# Patient Record
Sex: Female | Born: 1975 | Race: White | Hispanic: No | Marital: Single | Smoking: Never smoker
Health system: Southern US, Community
[De-identification: ages and names within clinical notes are randomized; demographics above are authoritative.]

## PROBLEM LIST (undated history)

## (undated) DIAGNOSIS — F988 Other specified behavioral and emotional disorders with onset usually occurring in childhood and adolescence: Secondary | ICD-10-CM

## (undated) DIAGNOSIS — T7840XA Allergy, unspecified, initial encounter: Secondary | ICD-10-CM

## (undated) DIAGNOSIS — R896 Abnormal cytological findings in specimens from other organs, systems and tissues: Secondary | ICD-10-CM

## (undated) DIAGNOSIS — IMO0001 Reserved for inherently not codable concepts without codable children: Secondary | ICD-10-CM

## (undated) DIAGNOSIS — I10 Essential (primary) hypertension: Secondary | ICD-10-CM

## (undated) DIAGNOSIS — E079 Disorder of thyroid, unspecified: Secondary | ICD-10-CM

## (undated) DIAGNOSIS — F419 Anxiety disorder, unspecified: Secondary | ICD-10-CM

## (undated) DIAGNOSIS — A64 Unspecified sexually transmitted disease: Secondary | ICD-10-CM

## (undated) DIAGNOSIS — R87810 Cervical high risk human papillomavirus (HPV) DNA test positive: Secondary | ICD-10-CM

## (undated) HISTORY — DX: Reserved for inherently not codable concepts without codable children: IMO0001

## (undated) HISTORY — DX: Anxiety disorder, unspecified: F41.9

## (undated) HISTORY — DX: Other specified behavioral and emotional disorders with onset usually occurring in childhood and adolescence: F98.8

## (undated) HISTORY — DX: Disorder of thyroid, unspecified: E07.9

## (undated) HISTORY — PX: DILATION AND CURETTAGE OF UTERUS: SHX78

## (undated) HISTORY — DX: Essential (primary) hypertension: I10

## (undated) HISTORY — DX: Allergy, unspecified, initial encounter: T78.40XA

## (undated) HISTORY — DX: Abnormal cytological findings in specimens from other organs, systems and tissues: R89.6

## (undated) HISTORY — DX: Unspecified sexually transmitted disease: A64

## (undated) HISTORY — PX: COLPOSCOPY: SHX161

## (undated) HISTORY — DX: Cervical high risk human papillomavirus (HPV) DNA test positive: R87.810

---

## 1998-06-30 ENCOUNTER — Inpatient Hospital Stay (HOSPITAL_COMMUNITY): Admission: AD | Admit: 1998-06-30 | Discharge: 1998-07-17 | Payer: Self-pay | Admitting: Obstetrics

## 1998-07-15 ENCOUNTER — Encounter: Payer: Self-pay | Admitting: Obstetrics

## 2010-11-13 ENCOUNTER — Ambulatory Visit (HOSPITAL_COMMUNITY)
Admission: RE | Admit: 2010-11-13 | Discharge: 2010-11-13 | Disposition: A | Discharge status: Home / Self Care | Payer: Self-pay | Source: Ambulatory Visit | Attending: Emergency Medicine | Admitting: Emergency Medicine

## 2010-11-13 DIAGNOSIS — M79609 Pain in unspecified limb: Secondary | ICD-10-CM | POA: Insufficient documentation

## 2010-11-13 DIAGNOSIS — M7989 Other specified soft tissue disorders: Secondary | ICD-10-CM | POA: Insufficient documentation

## 2011-06-01 ENCOUNTER — Other Ambulatory Visit: Payer: Self-pay | Admitting: Internal Medicine

## 2011-06-01 DIAGNOSIS — R1011 Right upper quadrant pain: Secondary | ICD-10-CM

## 2011-06-03 ENCOUNTER — Ambulatory Visit
Admission: RE | Admit: 2011-06-03 | Discharge: 2011-06-03 | Disposition: A | Discharge status: Home / Self Care | Payer: Commercial Managed Care - PPO | Source: Ambulatory Visit | Attending: Internal Medicine | Admitting: Internal Medicine

## 2011-06-03 DIAGNOSIS — R1011 Right upper quadrant pain: Secondary | ICD-10-CM

## 2011-06-05 ENCOUNTER — Other Ambulatory Visit: Payer: Self-pay

## 2011-07-16 ENCOUNTER — Other Ambulatory Visit (HOSPITAL_COMMUNITY)
Admission: RE | Admit: 2011-07-16 | Discharge: 2011-07-16 | Disposition: A | Discharge status: Home / Self Care | Payer: 59 | Source: Ambulatory Visit | Attending: Women's Health | Admitting: Women's Health

## 2011-07-16 ENCOUNTER — Ambulatory Visit (INDEPENDENT_AMBULATORY_CARE_PROVIDER_SITE_OTHER): Payer: 59 | Admitting: Women's Health

## 2011-07-16 ENCOUNTER — Encounter: Payer: Self-pay | Admitting: Women's Health

## 2011-07-16 VITALS — BP 120/80 | Ht 65.0 in | Wt 173.0 lb

## 2011-07-16 DIAGNOSIS — B009 Herpesviral infection, unspecified: Secondary | ICD-10-CM

## 2011-07-16 DIAGNOSIS — E039 Hypothyroidism, unspecified: Secondary | ICD-10-CM

## 2011-07-16 DIAGNOSIS — Z309 Encounter for contraceptive management, unspecified: Secondary | ICD-10-CM

## 2011-07-16 DIAGNOSIS — Z113 Encounter for screening for infections with a predominantly sexual mode of transmission: Secondary | ICD-10-CM

## 2011-07-16 DIAGNOSIS — Z1159 Encounter for screening for other viral diseases: Secondary | ICD-10-CM | POA: Insufficient documentation

## 2011-07-16 DIAGNOSIS — IMO0001 Reserved for inherently not codable concepts without codable children: Secondary | ICD-10-CM

## 2011-07-16 DIAGNOSIS — Z01419 Encounter for gynecological examination (general) (routine) without abnormal findings: Secondary | ICD-10-CM | POA: Insufficient documentation

## 2011-07-16 MED ORDER — VALACYCLOVIR HCL 1 G PO TABS: 1000.0000 mg | ORAL_TABLET | Freq: Two times a day (BID) | ORAL | Status: DC

## 2011-07-16 MED ORDER — DROSPIRENONE-ETHINYL ESTRADIOL 3-0.02 MG PO TABS: 1.0000 | ORAL_TABLET | Freq: Every day | ORAL | Status: DC

## 2011-07-16 NOTE — Progress Notes (Signed)
Alexis French [birth year] [id number]    History:    The patient presents for annual exam.  Monthly 3-4 day cycle on Yaz with several complaints. New patient to practice with questionable history of an abnormal Pap. States has had a right breast lump for greater than one year unchanged. History of HSV   Past medical history, past surgical history, family history and social history were all reviewed and documented in the EPIC chart. Hypertension/hypothyroidism/ADD/anxiety, primary care manages labs and medications. Works at [employer]   ROS:  A  ROS was performed and pertinent positives and negatives are included in the history.  Exam:  Filed Vitals:   07/16/11 0954  BP: 120/80    General appearance:  Normal Head/Neck:  Normal, without cervical or supraclavicular adenopathy. Thyroid:  Symmetrical, normal in size, without palpable masses or nodularity. Respiratory  Effort:  Normal  Auscultation:  Clear without wheezing or rhonchi Cardiovascular  Auscultation:  Regular rate, without rubs, murmurs or gallops  Edema/varicosities:  Not grossly evident Abdominal  Soft,nontender, without masses, guarding or rebound.  Liver/spleen:  No organomegaly noted  Hernia:  None appreciated  Skin  Inspection:  Grossly normal  Palpation:  Grossly normal Neurologic/psychiatric  Orientation:  Normal with appropriate conversation.  Mood/affect:  Normal  Genitourinary    Breasts: Examined lying and sitting.     Right: <.5cm mobile non tender nodule outer aspect, no retractions, discharge or axillary adenopathy.     Left: Without masses, retractions, discharge or axillary adenopathy.   Inguinal/mons:  Normal without inguinal adenopathy  External genitalia:  Normal  BUS/Urethra/Skene's glands:  Normal  Bladder:  Normal  Vagina:  Normal  Cervix:  Normal  Uterus:   normal in size, shape and contour.  Midline and mobile  Adnexa/parametria:     Rt: Without masses or  tenderness.   Lt: Without masses or tenderness.  Anus and perineum: Normal  Digital rectal exam: Normal sphincter tone without palpated masses or tenderness  Assessment/Plan:  36 y.o. S WF G1 P2  for annual exam Contracepting on Yaz with new partner.    HSV Questionable right breast < .5cm lump outer aspect 4 cm from areola at 9 o' clock STD screening Hypertension/hypothyroidism/ADD/anxiety-primary care labs and meds   Plan: Diagnostic mammogram/ultrasound for right breast to be scheduled. Continue SBEs and report changes. Encouraged increased exercise, calcium rich diet, MVI daily. Yaz prescription, proper use, slight risk for blood clots and strokes reviewed. Has done well on and declines a change. Condoms encouraged until permanent partner. Valtrex 1 g by mouth daily did review could use half tablet daily for suppression, states has rare outbreaks. TSH, Pap, GC/Chlamydia, HIV, hep C, RPR, instructed to have old records sent to office for review.    Harrington Challenger WHNP, 5:25 PM 07/16/2011

## 2011-07-17 ENCOUNTER — Telehealth: Payer: Self-pay | Admitting: *Deleted

## 2011-07-17 ENCOUNTER — Other Ambulatory Visit: Payer: Self-pay | Admitting: *Deleted

## 2011-07-17 DIAGNOSIS — N63 Unspecified lump in unspecified breast: Secondary | ICD-10-CM

## 2011-07-17 LAB — RPR

## 2011-07-17 LAB — HIV ANTIBODY (ROUTINE TESTING W REFLEX): HIV: NONREACTIVE

## 2011-07-17 LAB — GC/CHLAMYDIA PROBE AMP, GENITAL: Chlamydia, DNA Probe: NEGATIVE

## 2011-07-17 LAB — HEPATITIS C ANTIBODY: HCV Ab: NEGATIVE

## 2011-07-17 NOTE — Telephone Encounter (Signed)
Message copied by Libby Maw on Fri Jul 17, 2011 10:45 AM ------      Message from: [place], [place] J      Created: Thu Jul 16, 2011 10:37 AM       Needs diagnostic rt br mammogram/us  Questionable rt br nodule at outer aspect left of nipple non tender

## 2011-07-17 NOTE — Telephone Encounter (Signed)
Patient informed appt info.

## 2011-07-17 NOTE — Telephone Encounter (Signed)
Lm for patient to call. Have an appt set at [facility] for 07/24/11 @8 :20am.

## 2011-07-20 ENCOUNTER — Encounter: Payer: Self-pay | Admitting: *Deleted

## 2011-07-20 NOTE — Progress Notes (Signed)
Patient ID: Alexis French, female   DOB: 04/20/1976, 36 y.o.   MRN: [id number] Pt called wanting TSH level results, left results on pt vm as directed.

## 2011-07-24 ENCOUNTER — Other Ambulatory Visit: Payer: 59

## 2011-07-27 ENCOUNTER — Telehealth: Payer: Self-pay | Admitting: *Deleted

## 2011-07-27 NOTE — Telephone Encounter (Signed)
Alexis French please call patient and also inform her records were received/reviewed her Paps were normal with 1 ascus with negative high-risk HPV in past.

## 2011-07-27 NOTE — Telephone Encounter (Signed)
Pt calling wanted most recent pap result from 07/16/11 office visit. Please advise

## 2011-07-27 NOTE — Telephone Encounter (Signed)
Pap was ascus with negative HR HPV. Repeat Pap in 6 months. Please inform patient and put recall in computer

## 2011-07-28 NOTE — Telephone Encounter (Signed)
RECALL LETTER IN COMPUTER FOR 6 MONTH REPEAT PAP.

## 2011-07-28 NOTE — Telephone Encounter (Signed)
Spoke with pt regarding the below note, she is very concerned with why her pap smear is abnormal. Pt wants to speak with you regarding this. Work # [phone] please Francee Piccolo

## 2011-07-28 NOTE — Telephone Encounter (Signed)
Telephone call to patient and reviewed ascus with negative high-risk HPV. Questions answered will return in 6 months for repeat Pap.  Alexis French please put a six-month recall for a Pap repeat in computer- thank you

## 2011-07-28 NOTE — Telephone Encounter (Signed)
Lm for pt to call

## 2011-07-31 ENCOUNTER — Other Ambulatory Visit: Payer: 59

## 2011-08-07 ENCOUNTER — Ambulatory Visit
Admission: RE | Admit: 2011-08-07 | Discharge: 2011-08-07 | Disposition: A | Discharge status: Home / Self Care | Payer: 59 | Source: Ambulatory Visit | Attending: Women's Health | Admitting: Women's Health

## 2011-08-07 ENCOUNTER — Other Ambulatory Visit: Payer: Self-pay | Admitting: Women's Health

## 2011-08-07 DIAGNOSIS — N63 Unspecified lump in unspecified breast: Secondary | ICD-10-CM

## 2011-08-12 ENCOUNTER — Ambulatory Visit
Admission: RE | Admit: 2011-08-12 | Discharge: 2011-08-12 | Disposition: A | Discharge status: Home / Self Care | Payer: 59 | Source: Ambulatory Visit | Attending: Women's Health | Admitting: Women's Health

## 2011-08-12 DIAGNOSIS — N63 Unspecified lump in unspecified breast: Secondary | ICD-10-CM

## 2011-12-08 ENCOUNTER — Ambulatory Visit (INDEPENDENT_AMBULATORY_CARE_PROVIDER_SITE_OTHER): Payer: Commercial Managed Care - PPO | Admitting: Internal Medicine

## 2011-12-08 VITALS — BP 131/81 | HR 123 | Temp 98.3°F | Resp 16 | Ht 64.75 in | Wt 160.2 lb

## 2011-12-08 DIAGNOSIS — I10 Essential (primary) hypertension: Secondary | ICD-10-CM

## 2011-12-08 DIAGNOSIS — G4700 Insomnia, unspecified: Secondary | ICD-10-CM

## 2011-12-08 DIAGNOSIS — L559 Sunburn, unspecified: Secondary | ICD-10-CM

## 2011-12-08 DIAGNOSIS — F419 Anxiety disorder, unspecified: Secondary | ICD-10-CM

## 2011-12-08 DIAGNOSIS — F329 Major depressive disorder, single episode, unspecified: Secondary | ICD-10-CM | POA: Insufficient documentation

## 2011-12-08 DIAGNOSIS — F988 Other specified behavioral and emotional disorders with onset usually occurring in childhood and adolescence: Secondary | ICD-10-CM

## 2011-12-08 DIAGNOSIS — F32A Depression, unspecified: Secondary | ICD-10-CM | POA: Insufficient documentation

## 2011-12-08 DIAGNOSIS — L551 Sunburn of second degree: Secondary | ICD-10-CM

## 2011-12-08 MED ORDER — CLOBETASOL PROPIONATE 0.05 % EX CREA: TOPICAL_CREAM | Freq: Two times a day (BID) | CUTANEOUS | Status: DC

## 2011-12-08 MED ORDER — AMPHETAMINE-DEXTROAMPHET ER 25 MG PO CP24: 25.0000 mg | ORAL_CAPSULE | ORAL | Status: DC

## 2011-12-08 MED ORDER — ZOLPIDEM TARTRATE 10 MG PO TABS: 10.0000 mg | ORAL_TABLET | Freq: Every evening | ORAL | Status: DC | PRN

## 2011-12-08 MED ORDER — IBUPROFEN 600 MG PO TABS: 600.0000 mg | ORAL_TABLET | Freq: Three times a day (TID) | ORAL | Status: AC | PRN

## 2011-12-08 NOTE — Progress Notes (Signed)
  Subjective:    Patient ID: Alexis French, female    DOB: 08-Nov-1975, 36 y.o.   MRN: [id number]  HPI Has severe 1st degree sunburn for 1-2d. Painful, [place], swollen legs and feet 1+ Also needs RF of adderall xr 25mg , and ambien 10mg    Review of Systems     Objective:   Physical Exam Sunburn entire torso Edema lower legs     Assessment & Plan:  Sunburn 1st or mild 2nd Clobetasol cream not face or genitals Motrin 800mg   RF meds

## 2011-12-08 NOTE — Patient Instructions (Signed)
Sunburn  Sunburn is damage to the skin caused by overexposure to ultraviolet (UV) rays. People with light skin or a fair complexion may be more susceptible to sunburn. Repeated sun exposure causes early skin aging such as wrinkles and sun spots. It also increases the risk of skin cancer.  CAUSES  A sunburn is caused by getting too much UV radiation from the sun.  SYMPTOMS   Red or pink skin.   Soreness and swelling.   Pain.   Blisters.   Peeling skin.   Headache, fever, and fatigue if sunburn covers a large area.  TREATMENT   Your caregiver may tell you to take certain medicines to lessen inflammation.   Your caregiver may have you use hydrocortisone cream or spray to help with itching and inflammation.   Your caregiver may prescribe an antibiotic cream to use on blisters.  HOME CARE INSTRUCTIONS    Avoid further exposure to the sun.   Cool baths and cool compresses may be helpful if used several times per day. Do not apply ice, since this may result in more damage to the skin.   Only take over-the-counter or prescription medicines for pain, discomfort, or fever as directed by your caregiver.   Use aloe or other over-the-counter sunburn creams or gels on your skin. Do not apply these creams or gels on blisters.   Drink enough fluids to keep your urine clear or pale yellow.   Do not break blisters. If blisters break, your caregiver may recommend an antibiotic cream to apply to the affected area.  PREVENTION    Try to avoid the sun between 10:00 a.m. and 4:00 p.m. when it is the strongest.   Use a sunscreen or sunblock with SPF 30 or greater.   Apply sunscreen at least 30 minutes before exposure to the sun.   Always wear protective hats, clothing, and sunglasses with UV protection.   Avoid medicines, herbs, and foods that increase your sensitivity to sunlight.   Avoid tanning beds.  SEEK IMMEDIATE MEDICAL CARE IF:    You have a fever.   Your pain is uncontrolled with medicine.   You start to  vomit or have diarrhea.   You feel faint or develop a headache with confusion.   You develop severe blistering.   You have a pus-like (purulent) discharge coming from the blisters.   Your burn becomes more painful and swollen.  MAKE SURE YOU:   Understand these instructions.   Will watch your condition.   Will get help right away if you are not doing well or get worse.  Document Released: 04/01/2005 Document Revised: 06/11/2011 Document Reviewed: 12/14/2010  Exit[employer] Patient Information 2012 [employer].

## 2011-12-09 ENCOUNTER — Telehealth: Payer: Self-pay | Admitting: Family Medicine

## 2011-12-09 ENCOUNTER — Telehealth: Payer: Self-pay | Admitting: *Deleted

## 2011-12-09 MED ORDER — DROSPIRENONE-ETHINYL ESTRADIOL 3-0.02 MG PO TABS: 1.0000 | ORAL_TABLET | Freq: Every day | ORAL | Status: DC

## 2011-12-09 MED ORDER — SILVER SULFADIAZINE 1 % EX CREA: TOPICAL_CREAM | Freq: Every day | CUTANEOUS | Status: DC

## 2011-12-09 NOTE — Telephone Encounter (Signed)
Pt.notified

## 2011-12-09 NOTE — Telephone Encounter (Signed)
Patient would like a prescription for silvadene for severe sunburn

## 2011-12-09 NOTE — Telephone Encounter (Signed)
Sent to pharmacy 

## 2011-12-09 NOTE — Telephone Encounter (Signed)
rx sent to pharmacy, pt informed. 

## 2011-12-09 NOTE — Telephone Encounter (Signed)
Please call in brand name only Yaz one by mouth daily with refills through her next annual exam. Have her call if problems continue.

## 2011-12-09 NOTE — Telephone Encounter (Signed)
Pt would like to switch birth control to brand only yaz, she is currently taking the generic brand, she c/o more mood swings that she didn't have with taking brand. Okay to switch?

## 2012-01-24 ENCOUNTER — Ambulatory Visit (INDEPENDENT_AMBULATORY_CARE_PROVIDER_SITE_OTHER): Payer: Commercial Managed Care - PPO | Admitting: Physician Assistant

## 2012-01-24 VITALS — BP 124/82 | HR 70 | Temp 98.5°F | Resp 16 | Ht 64.75 in | Wt 160.0 lb

## 2012-01-24 DIAGNOSIS — K1370 Unspecified lesions of oral mucosa: Secondary | ICD-10-CM

## 2012-01-24 DIAGNOSIS — K0889 Other specified disorders of teeth and supporting structures: Secondary | ICD-10-CM

## 2012-01-24 MED ORDER — CLINDAMYCIN HCL 300 MG PO CAPS: 300.0000 mg | ORAL_CAPSULE | Freq: Three times a day (TID) | ORAL | Status: DC

## 2012-01-25 ENCOUNTER — Encounter: Payer: Self-pay | Admitting: Physician Assistant

## 2012-01-25 NOTE — Progress Notes (Signed)
  Subjective:    Patient ID: Alexis French, female    DOB: 31-Oct-1975, 36 y.o.   MRN: [id number]  HPI Alexis French comes in today c/o upper left molar pain s/p extraction earlier in week.  She has noticed a bad taste in her mouth and feels that her breath smells.  She has pain at the extraction site and feels that a remnant of the tooth remains because she feels something sharp.  She saw the surgeon 2 days ago and was told she had a dry socket and was put on prednisone.  She feels it is getting worse. No fever or chills.   Review of Systems As noted in HPI, otherwise negative     Objective:   Physical Exam  HENT:  Mouth/Throat: No oral lesions. No dental abscesses or lacerations.         Right 4th left molar site with normal gingiva.  Packing noted in socket.  No active drainage noted.  No gingival swelling noted.  Lymphadenopathy:       Head (left side): No submental, no submandibular and no tonsillar adenopathy present.    She has no cervical adenopathy.  Skin: Skin is warm.          Assessment & Plan:  Tooth pain, s/p extraction ?early abscess  Start Clindamycin 300 mg tid Gargles Follow up in the morning with dentist/surgeon

## 2012-01-27 ENCOUNTER — Encounter: Payer: Self-pay | Admitting: Physician Assistant

## 2012-01-27 ENCOUNTER — Ambulatory Visit: Payer: Commercial Managed Care - PPO | Admitting: Physician Assistant

## 2012-01-27 VITALS — BP 120/82 | HR 72 | Temp 98.7°F | Resp 16

## 2012-01-27 DIAGNOSIS — F411 Generalized anxiety disorder: Secondary | ICD-10-CM

## 2012-01-27 DIAGNOSIS — F988 Other specified behavioral and emotional disorders with onset usually occurring in childhood and adolescence: Secondary | ICD-10-CM

## 2012-01-27 DIAGNOSIS — R404 Transient alteration of awareness: Secondary | ICD-10-CM

## 2012-01-27 DIAGNOSIS — B009 Herpesviral infection, unspecified: Secondary | ICD-10-CM

## 2012-01-27 DIAGNOSIS — E039 Hypothyroidism, unspecified: Secondary | ICD-10-CM

## 2012-01-27 DIAGNOSIS — I10 Essential (primary) hypertension: Secondary | ICD-10-CM

## 2012-01-27 DIAGNOSIS — IMO0001 Reserved for inherently not codable concepts without codable children: Secondary | ICD-10-CM

## 2012-01-27 DIAGNOSIS — G4700 Insomnia, unspecified: Secondary | ICD-10-CM

## 2012-01-27 DIAGNOSIS — F419 Anxiety disorder, unspecified: Secondary | ICD-10-CM

## 2012-01-27 LAB — COMPREHENSIVE METABOLIC PANEL
Albumin: 4.3 g/dL (ref 3.5–5.2)
Alkaline Phosphatase: 60 U/L (ref 39–117)
BUN: 13 mg/dL (ref 6–23)
CO2: 30 mEq/L (ref 19–32)
Calcium: 9.8 mg/dL (ref 8.4–10.5)
Chloride: 101 mEq/L (ref 96–112)
Glucose, Bld: 88 mg/dL (ref 70–99)
Potassium: 4.6 mEq/L (ref 3.5–5.3)
Sodium: 137 mEq/L (ref 135–145)
Total Protein: 7.4 g/dL (ref 6.0–8.3)

## 2012-01-27 MED ORDER — NORETHIN ACE-ETH ESTRAD-FE 1-20 MG-MCG(24) PO TABS: 1.0000 | ORAL_TABLET | Freq: Every day | ORAL | Status: DC

## 2012-01-27 MED ORDER — METHOCARBAMOL 500 MG PO TABS: 500.0000 mg | ORAL_TABLET | Freq: Three times a day (TID) | ORAL | Status: DC

## 2012-01-27 MED ORDER — ZOLPIDEM TARTRATE 10 MG PO TABS: 10.0000 mg | ORAL_TABLET | Freq: Every evening | ORAL | Status: DC | PRN

## 2012-01-27 MED ORDER — HYDROCHLOROTHIAZIDE 25 MG PO TABS: 25.0000 mg | ORAL_TABLET | Freq: Every day | ORAL | Status: DC

## 2012-01-27 MED ORDER — VALACYCLOVIR HCL 1 G PO TABS: 1000.0000 mg | ORAL_TABLET | Freq: Two times a day (BID) | ORAL | Status: DC

## 2012-01-27 MED ORDER — AMPHETAMINE-DEXTROAMPHET ER 25 MG PO CP24: 25.0000 mg | ORAL_CAPSULE | ORAL | Status: DC

## 2012-01-27 NOTE — Progress Notes (Signed)
Subjective:    Patient ID: Alexis French, female    DOB: 02-20-1976, 36 y.o.   MRN: [id number]  HPI Pt here to establish care and medication refills. 1- HTN - well controlled on HCTZ which is mainly for LE edema.  She has not used her Micardis in about 1.5 wks. 2- ADD - Adderall XR25mg  qam works well for her. 3- insomni a- well controlled with nightly Ambien 10mg . 4- spinal stenosis - uses Robaxin prn for muscle spasms 4- HSV genital - well controlled on daily valtrex, only has outbreaks when she is really stressed for a long period of time 5- birth control - currently on Yaz but has noticed that her menses are irregular and typically start sometimes in the 1st week of her pack - she would like to have more control over her menses - she had an ASCUS with neg HPV pap 1/13 she is thinking of having her next one here. 6- anxiety -significant improvement from the past - new job has helped - rarely takes her Xanax and has stopped her Buspar. 7- hypothyroidism - feels tired all the time and cannot seem to lose weight- hungry all the time - wonders if her control is off - she uses generic synthroid  Today feels well.   Review of Systems  Constitutional: Positive for fatigue.  Psychiatric/Behavioral: Positive for disturbed wake/sleep cycle.       Objective:   Physical Exam  Vitals reviewed. Constitutional: She is oriented to person, place, and time. She appears well-developed and well-nourished.  HENT:  Head: Normocephalic and atraumatic.  Right Ear: External ear normal.  Left Ear: External ear normal.  Neck: Neck supple. No thyromegaly present.  Cardiovascular: Normal rate, regular rhythm and normal heart sounds.  Exam reveals no gallop.   No murmur heard. Pulmonary/Chest: Effort normal and breath sounds normal.  Neurological: She is alert and oriented to person, place, and time.  Skin: Skin is warm and dry.  Psychiatric: She has a normal mood and affect. Her behavior is normal.  Judgment and thought content normal.       Assessment & Plan:   1. Hypothyroidism  TSH  2. HSV infection  valACYclovir (VALTREX) 1000 MG tablet, DISCONTINUED: valACYclovir (VALTREX) 1000 MG tablet  3. Anxiety  zolpidem (AMBIEN) 10 MG tablet  4. Insomnia  zolpidem (AMBIEN) 10 MG tablet, methocarbamol (ROBAXIN) 500 MG tablet, DISCONTINUED: methocarbamol (ROBAXIN) 500 MG tablet  5. ADD (attention deficit disorder)  amphetamine-dextroamphetamine (ADDERALL XR) 25 MG 24 hr capsule, hydrochlorothiazide (HYDRODIURIL) 25 MG tablet, DISCONTINUED: hydrochlorothiazide (HYDRODIURIL) 25 MG tablet  6. HTN (hypertension)  Comprehensive metabolic panel, hydrochlorothiazide (HYDRODIURIL) 25 MG tablet, DISCONTINUED: hydrochlorothiazide (HYDRODIURIL) 25 MG tablet  7. Birth control  Norethindrone Acetate-Ethinyl Estrad-FE (LOESTRIN 24 FE) 1-20 MG-MCG(24) tablet, DISCONTINUED: Norethindrone Acetate-Ethinyl Estrad-FE (LOESTRIN 24 FE) 1-20 MG-MCG(24) tablet   1- will check TSH and adjust medication as necessary. D/w pt will try brand to see if better control. 2- continue daily suppressive therapy 3- continue prn medication - if usage increases in the future will start daily medication 4- continue daily medication - can refill for 6 months 5- continue daily medication  - can refill for 6 months 6- will stop micardis for now due to good BP control - pt to monitor over the next several weeks and if BP starts to increase will restart medication - will continue on HCTZ for now 7- pt desires to continue on OCP.  Will stop YAz and start loestrin 24 to keep estrogen  dose low - if over the next 3 months pt continues to have irregular menses start will increase estrogen  Pt agrees with and understands the above plan.  Will plan recheck in 2 months.

## 2012-01-28 ENCOUNTER — Other Ambulatory Visit: Payer: Self-pay | Admitting: Physician Assistant

## 2012-01-28 MED ORDER — LEVOTHYROXINE SODIUM 150 MCG PO TABS: 150.0000 ug | ORAL_TABLET | Freq: Every day | ORAL | Status: DC

## 2012-01-28 NOTE — Telephone Encounter (Signed)
Spoke with pt regarding  Her thyroid results and medication dose.  Meds sent to pharmacy.

## 2012-02-24 ENCOUNTER — Ambulatory Visit (INDEPENDENT_AMBULATORY_CARE_PROVIDER_SITE_OTHER): Payer: Commercial Managed Care - PPO | Admitting: Family Medicine

## 2012-02-24 DIAGNOSIS — M25569 Pain in unspecified knee: Secondary | ICD-10-CM

## 2012-02-24 DIAGNOSIS — M25562 Pain in left knee: Secondary | ICD-10-CM

## 2012-02-24 NOTE — Assessment & Plan Note (Signed)
Patient has left knee pain. Patient does not have any meniscal signs or any ligamentous type injury. Patient has no signs of swelling at this time either. Patient does have some mild laxity over all of all her ligaments bilaterally but I do feel that this is a normal variant. Gave patient different exercises to do at home strengthening mostly the hamstrings as well as the quadriceps. In addition this patient told to get a knee sleeve that she can get over-the-counter. Told her to that potentially been on her feet all day might be contributing to this and she needs better insoles to relieve some of the stress. Patient will try these things and follow up in 4 weeks.

## 2012-02-24 NOTE — Progress Notes (Signed)
  Subjective:    Patient ID: Alexis French, female    DOB: 02/04/1976, 36 y.o.   MRN: [id number]  HPI 36 year old female coming in for left knee pain. Patient has had this pain for which he says a year and half. Patient states that the initial injury occurred when her dog ran into her causing her to rotate your knee and felt a pop and had significant amount of swelling. Since that time the knee has felt somewhat unstable with certain movements. Recently she states that she's had some more swelling with the knee. Patient denies any redness any fevers chills or shortness of breath. Patient denies any locking of the knee any cracking popping or giving out on her. Patient denies any numbness of the extremity distally.  Patient states for the pain she is taking Robaxin with minimal improvement. Review of Systems As stated above in history of present illness    Objective:   Physical Exam General: No apparent distress alert and oriented x3 Knee:left Normal to inspection with no erythema or effusion or obvious bony abnormalities. Palpation normal with no warmth or joint line tenderness or patellar tenderness or condyle tenderness. ROM normal in flexion and extension and lower leg rotation. Ligaments with solid consistent endpoints including ACL, PCL, LCL, MCL compared to contralateral knee. Negative Mcmurray's and provocative meniscal tests. Non painful patellar compression. Patellar and quadriceps tendons unremarkable. Hamstring and quadriceps strength is normal.      Assessment & Plan:

## 2012-02-28 ENCOUNTER — Telehealth: Payer: Self-pay

## 2012-02-28 DIAGNOSIS — F988 Other specified behavioral and emotional disorders with onset usually occurring in childhood and adolescence: Secondary | ICD-10-CM

## 2012-02-28 MED ORDER — AMPHETAMINE-DEXTROAMPHET ER 25 MG PO CP24: 25.0000 mg | ORAL_CAPSULE | ORAL | Status: DC

## 2012-02-28 NOTE — Telephone Encounter (Signed)
rx printed

## 2012-02-28 NOTE — Telephone Encounter (Signed)
Pt needs a RF of her Adderall. Thanks

## 2012-03-02 NOTE — Progress Notes (Signed)
Xray read and patient discussed with Dr. Smith. Agree with assessment and plan of care per his note.   

## 2012-03-30 ENCOUNTER — Ambulatory Visit (INDEPENDENT_AMBULATORY_CARE_PROVIDER_SITE_OTHER): Payer: 59 | Admitting: Family Medicine

## 2012-03-30 VITALS — BP 144/94 | HR 93 | Temp 98.7°F | Resp 16

## 2012-03-30 DIAGNOSIS — Z207 Contact with and (suspected) exposure to pediculosis, acariasis and other infestations: Secondary | ICD-10-CM

## 2012-03-30 DIAGNOSIS — R233 Spontaneous ecchymoses: Secondary | ICD-10-CM

## 2012-03-30 DIAGNOSIS — Z2089 Contact with and (suspected) exposure to other communicable diseases: Secondary | ICD-10-CM

## 2012-03-30 LAB — TSH: TSH: 0.642 u[IU]/mL (ref 0.350–4.500)

## 2012-03-30 LAB — POCT CBC
HCT, POC: 46.5 % (ref 37.7–47.9)
Hemoglobin: 15 g/dL (ref 12.2–16.2)
Lymph, poc: 2.1 (ref 0.6–3.4)
MCH, POC: 33 pg — AB (ref 27–31.2)
MCHC: 32.3 g/dL (ref 31.8–35.4)
MCV: 102.4 fL — AB (ref 80–97)
POC Granulocyte: 4.4 (ref 2–6.9)
POC LYMPH PERCENT: 30.1 %L (ref 10–50)
RDW, POC: 12.6 %
WBC: 6.9 10*3/uL (ref 4.6–10.2)

## 2012-03-30 LAB — COMPREHENSIVE METABOLIC PANEL
Alkaline Phosphatase: 68 U/L (ref 39–117)
BUN: 10 mg/dL (ref 6–23)
CO2: 29 mEq/L (ref 19–32)
Glucose, Bld: 102 mg/dL — ABNORMAL HIGH (ref 70–99)
Sodium: 138 mEq/L (ref 135–145)
Total Bilirubin: 0.5 mg/dL (ref 0.3–1.2)
Total Protein: 7.3 g/dL (ref 6.0–8.3)

## 2012-03-30 LAB — POCT SEDIMENTATION RATE: POCT SED RATE: 15 mm/hr (ref 0–22)

## 2012-03-30 MED ORDER — NYSTATIN-TRIAMCINOLONE 100000-0.1 UNIT/GM-% EX OINT: TOPICAL_OINTMENT | Freq: Two times a day (BID) | CUTANEOUS | Status: DC

## 2012-03-30 MED ORDER — PERMETHRIN 5 % EX CREA: TOPICAL_CREAM | CUTANEOUS | Status: DC

## 2012-03-30 NOTE — Progress Notes (Signed)
[address]   [place], [place]  [address]   [phone]  Subjective:    Patient ID: Alexis French, female    DOB: [birth year], 36 y.o.   MRN: [id number]  HPIThis 36 y.o. female presents for evaluation of scabies exposure.  Took sons to doctor due to blisters on toes.  Scabs on both legs from itching.  Doctor did not give diagnosis; sent in rx to pharmacy; picked up rx for Prednisone, Premethrin cream.  No rash on patient currently but very worried that may spread scabies to patients and coworkers; desires to go home/leave work and undergo treatment for scabies.  Has some itching. Has bite L posterior axilla.  Would like to go home.   Very worried that may have scabies.  Not pleased at all with children's physician.  Little neighbor kid now has rash.  No new pets.  Not sure if neighbors have new pets.  Very paranoid.  Did not call pediatrician regarding medication until today; nurse did state that diagnosis was scabies.  2.  Leg rash: onset one month ago.  No associated itching, burning, pain.  No pustules, vesicles, scaling.  Rash is spreading.  Started as one single lesion on RLE; now has single lesion on LLE and multiple areas on RLE.  No new pets.  No nodules under skin.  Did switch OCP recently;also switched from levothyroxine to Synthroid recently.  Last TSH normal in 01/2012 but has had labile levels; thus, the switch to name brand Synthroid.   Review of Systems  Constitutional: Negative for fever, chills, activity change, appetite change, fatigue and unexpected weight change.  Skin: Positive for color change and rash. Negative for pallor and wound.  Hematological: Negative for adenopathy. Does not bruise/bleed easily.  Psychiatric/Behavioral: The patient is nervous/anxious.         Past Medical History  Diagnosis Date  . Thyroid disease     hyperthyroidism  . Hypertension   . STD (sexually transmitted disease)     HSV2  . Attention deficit disorder (ADD)   . Anxiety     Past  Surgical History  Procedure Date  . Dilation and curettage of uterus APPROX. 2006    Prior to Admission medications   Medication Sig Start Date End Date Taking? Authorizing Provider  ALPRAZolam Prudy Feeler) 1 MG tablet Take 1 mg by mouth at bedtime as needed.   Yes Historical Provider, MD  amphetamine-dextroamphetamine (ADDERALL XR) 25 MG 24 hr capsule Take 1 capsule (25 mg total) by mouth every morning. 02/28/12  Yes Chelle S Jeffery, PA-C  b complex vitamins tablet Take 1 tablet by mouth daily.   Yes Historical Provider, MD  hydrochlorothiazide (HYDRODIURIL) 25 MG tablet Take 1 tablet (25 mg total) by mouth daily. 01/27/12  Yes Morrell Riddle, PA-C  levothyroxine (SYNTHROID, LEVOTHROID) 150 MCG tablet Take 1 tablet (150 mcg total) by mouth daily. 01/28/12  Yes Morrell Riddle, PA-C  methocarbamol (ROBAXIN) 500 MG tablet Take 1 tablet (500 mg total) by mouth 3 (three) times daily. 01/27/12  Yes Morrell Riddle, PA-C  Norethindrone Acetate-Ethinyl Estrad-FE (LOESTRIN 24 FE) 1-20 MG-MCG(24) tablet Take 1 tablet by mouth daily. 01/27/12 01/26/13 Yes Sarah Harvie Bridge, PA-C  valACYclovir (VALTREX) 1000 MG tablet Take 1 tablet (1,000 mg total) by mouth 2 (two) times daily. 01/27/12  Yes Morrell Riddle, PA-C  zolpidem (AMBIEN) 10 MG tablet Take 1 tablet (10 mg total) by mouth at bedtime as needed. 01/27/12  Yes Morrell Riddle, PA-C  Allergies  Allergen Reactions  . Penicillins     History   Social History  . Marital Status: Single    Spouse Name: N/A    Number of Children: N/A  . Years of Education: N/A   Occupational History  . Not on file.   Social History Main Topics  . Smoking status: Never Smoker   . Smokeless tobacco: Never Used  . Alcohol Use: Yes     SOCIALLY ONLY  . Drug Use: No  . Sexually Active: Yes    Birth Control/ Protection: Pill   Other Topics Concern  . Not on file   Social History Narrative  . No narrative on file    Family History  Problem Relation Age of Onset  .  Diabetes Mother   . Hypertension Mother   . Diabetes Sister   . Diabetes Maternal Grandmother   . Hypertension Maternal Grandfather   . Diabetes Paternal Grandmother     Objective:   Physical Exam  Nursing note and vitals reviewed. Constitutional: She appears well-developed and well-nourished.  Cardiovascular: Intact distal pulses.   Musculoskeletal: She exhibits no edema.  Skin: Rash noted. No erythema. No pallor.       L POSTERIOR SHOULDER: SINGLE MACULOPAPULAR LESION WITHOUT PUSTULE, VESICLE; VERY MINIMAL INDURATION; NO ERYTHEMA.  NO RASH OF INTERDIGIT SPACES, AXILLARY REGIONS, LOWER EXTREMITIES AT ANKLES, UPPER EXTREMITY AT WRISTS.  LOWER EXTREMITIES:  R>L; ANNULAR RASH NON-BLANCHING, PETECHIAL IN NATURE LOWER LEG; NO ASSOCIATED SCALING; NOT RAISED ABOVE SKIN. MULTIPLE ANNULAR LESIONS R LOWER LEG; 1-2 LESIONS L LOWER LEG. NO NODULARITY TO LESIONS; NON-TENDER.       Results for orders placed in visit on 03/30/12  POCT CBC      Component Value Range   WBC 6.9  4.6 - 10.2 K/uL   Lymph, poc 2.1  0.6 - 3.4   POC LYMPH PERCENT 30.1  10 - 50 %L   MID (cbc) 0.4  0 - 0.9   POC MID % 5.8  0 - 12 %M   POC Granulocyte 4.4  2 - 6.9   Granulocyte percent 64.1  37 - 80 %G   RBC 4.54  4.04 - 5.48 M/uL   Hemoglobin 15.0  12.2 - 16.2 g/dL   HCT, POC 16.1  09.6 - 47.9 %   MCV 102.4 (*) 80 - 97 fL   MCH, POC 33.0 (*) 27 - 31.2 pg   MCHC 32.3  31.8 - 35.4 g/dL   RDW, POC 04.5     Platelet Count, POC 307  142 - 424 K/uL   MPV 8.6  0 - 99.8 fL    Assessment & Plan:   1. Petechial rash  POCT CBC, Comprehensive metabolic panel, TSH, POCT SEDIMENTATION RATE, nystatin-triamcinolone ointment (MYCOLOG)  2. Scabies exposure  permethrin (ACTICIN) 5 % cream     1.  Petechial annular rash BLE:  New.  Etiology unclear; non-blanching to suggest vascular etiology.  Obtain labs including TSH, CBC, CMET, ESR.  Treat empirically with Mycolog cream.  If no improvement in two weeks, recommend  dermatology consultation. 2.  Scabies exposure: New.  Two children undergoing treatment for scabies; patient without active lesions yet concern for spreading scabies to immunosuppressed patients; rx provided to use tonight; note for work provided for today.

## 2012-03-30 NOTE — Patient Instructions (Addendum)
1. Petechial rash  POCT CBC, Comprehensive metabolic panel, TSH, POCT SEDIMENTATION RATE, nystatin-triamcinolone ointment (MYCOLOG)  2. Scabies exposure  permethrin (ACTICIN) 5 % cream   Scabies Scabies are small bugs (mites) that burrow under the skin and cause red bumps and severe itching. These bugs can only be seen with a microscope. Scabies are highly contagious. They can spread easily from person to person by direct contact. They are also spread through sharing clothing or linens that have the scabies mites living in them. It is not unusual for an entire family to become infected through shared towels, clothing, or bedding.  HOME CARE INSTRUCTIONS   Your caregiver may prescribe a cream or lotion to kill the mites. If this cream is prescribed; massage the cream into the entire area of the body from the neck to the bottom of both feet. Also massage the cream into the scalp and face if your child is less than 44 year old. Avoid the eyes and mouth.   Leave the cream on for 8 to12 hours. Do not wash your hands after application. Your child should bathe or shower after the 8 to 12 hour application period. Sometimes it is helpful to apply the cream to your child at right before bedtime.   One treatment is usually effective and will eliminate approximately 95% of infestations. For severe cases, your caregiver may decide to repeat the treatment in 1 week. Everyone in your household should be treated with one application of the cream.   New rashes or burrows should not appear after successful treatment within 24 to 48 hours; however the itching and rash may last for 2 to 4 weeks after successful treatment. If your symptoms persist longer than this, see your caregiver.   Your caregiver also may prescribe a medication to help with the itching or to help the rash go away more quickly.   Scabies can live on clothing or linens for up to 3 days. Your entire child's recently used clothing, towels, stuffed toys,  and bed linens should be washed in hot water and then dried in a dryer for at least 20 minutes on high heat. Items that cannot be washed should be enclosed in a plastic bag for at least 3 days.   To help relieve itching, bathe your child in a cool bath or apply cool washcloths to the affected areas.   Your child may return to school after treatment with the prescribed cream.  SEEK MEDICAL CARE IF:   The itching persists longer than 4 weeks after treatment.   The rash spreads or becomes infected (the area has red blisters or yellow-tan crust).  Document Released: 06/22/2005 Document Revised: 06/11/2011 Document Reviewed: 10/31/2008 [facility] Patient Information 2012 [place], [place].

## 2012-04-03 ENCOUNTER — Telehealth: Payer: Self-pay

## 2012-04-03 DIAGNOSIS — F988 Other specified behavioral and emotional disorders with onset usually occurring in childhood and adolescence: Secondary | ICD-10-CM

## 2012-04-03 MED ORDER — AMPHETAMINE-DEXTROAMPHET ER 25 MG PO CP24: 25.0000 mg | ORAL_CAPSULE | ORAL | Status: DC

## 2012-04-03 NOTE — Progress Notes (Signed)
Reviewed and agree.

## 2012-04-03 NOTE — Telephone Encounter (Signed)
Refill done and printed

## 2012-04-03 NOTE — Telephone Encounter (Signed)
Pt notified that rx is ready for pick up

## 2012-04-03 NOTE — Telephone Encounter (Signed)
Pt needs a RF of Adderall

## 2012-04-26 ENCOUNTER — Telehealth: Payer: Self-pay

## 2012-04-26 DIAGNOSIS — F988 Other specified behavioral and emotional disorders with onset usually occurring in childhood and adolescence: Secondary | ICD-10-CM

## 2012-04-26 DIAGNOSIS — IMO0001 Reserved for inherently not codable concepts without codable children: Secondary | ICD-10-CM

## 2012-04-26 DIAGNOSIS — I10 Essential (primary) hypertension: Secondary | ICD-10-CM

## 2012-04-26 NOTE — Telephone Encounter (Signed)
Alexis French, Pt needs a RF of HCTZ, Loestrin, and Synthroid. Thanks

## 2012-04-27 MED ORDER — LEVOTHYROXINE SODIUM 150 MCG PO TABS: 150.0000 ug | ORAL_TABLET | Freq: Every day | ORAL | Status: DC

## 2012-04-27 NOTE — Telephone Encounter (Signed)
Spoke to patient

## 2012-04-27 NOTE — Telephone Encounter (Signed)
I have sent a RF of synthroid - the other 2 should have refills - we do need to do her labs.

## 2012-05-05 ENCOUNTER — Ambulatory Visit (INDEPENDENT_AMBULATORY_CARE_PROVIDER_SITE_OTHER): Payer: 59 | Admitting: Physician Assistant

## 2012-05-05 VITALS — BP 130/92 | HR 90 | Temp 98.1°F | Resp 16

## 2012-05-05 DIAGNOSIS — E039 Hypothyroidism, unspecified: Secondary | ICD-10-CM

## 2012-05-05 DIAGNOSIS — F988 Other specified behavioral and emotional disorders with onset usually occurring in childhood and adolescence: Secondary | ICD-10-CM

## 2012-05-05 MED ORDER — AMPHETAMINE-DEXTROAMPHET ER 25 MG PO CP24: 25.0000 mg | ORAL_CAPSULE | ORAL | Status: DC

## 2012-05-05 NOTE — Progress Notes (Signed)
   [address] [place] [address]   Phone [phone]  Subjective:    Patient ID: Alexis French, female    DOB: [birth year], 36 y.o.   MRN: [id number]  HPI Pt here for ADD med refill.  She is doing well on her current dose.  She has been on Adderall XR 30 and did not like it. She is otherwise doing good.  Reviewed her labs - she is on upper limit of normal for her thyroid.  We will monitor in 6 months.  She is currently taking brand name.  Happy with her OCP change - she is not having breakthrough bleeding anymore.  Review of Systems  Psychiatric/Behavioral: Positive for decreased concentration (only when she does not take her medications).       Objective:   Physical Exam  Vitals reviewed. Constitutional: She is oriented to person, place, and time. She appears well-developed and well-nourished.  HENT:  Head: Normocephalic and atraumatic.  Right Ear: External ear normal.  Left Ear: External ear normal.  Pulmonary/Chest: Effort normal.  Neurological: She is alert and oriented to person, place, and time.  Skin: Skin is warm and dry.  Psychiatric: She has a normal mood and affect. Her behavior is normal. Judgment and thought content normal.          Assessment & Plan:   1. ADD (attention deficit disorder)  amphetamine-dextroamphetamine (ADDERALL XR) 25 MG 24 hr capsule, amphetamine-dextroamphetamine (ADDERALL XR) 25 MG 24 hr capsule, amphetamine-dextroamphetamine (ADDERALL XR) 25 MG 24 hr capsule   Recheck in 3 months for TSH and 6 months for ADD.

## 2012-05-24 ENCOUNTER — Telehealth: Payer: Self-pay | Admitting: Family Medicine

## 2012-05-24 MED ORDER — DESOGESTREL-ETHINYL ESTRADIOL 0.15-30 MG-MCG PO TABS: 1.0000 | ORAL_TABLET | Freq: Every day | ORAL | Status: DC

## 2012-05-24 NOTE — Telephone Encounter (Signed)
Pt called- states that BCP are not regulating her cycle- she is having break-through bleeding and ACNE!!!! Would like something else called in please. Pharmacy- [facility]  Thanks! Eileen Stanford

## 2012-07-14 ENCOUNTER — Other Ambulatory Visit: Payer: Self-pay | Admitting: Physician Assistant

## 2012-07-14 NOTE — Telephone Encounter (Signed)
Patient advised her meds were sent in for her.

## 2012-07-21 ENCOUNTER — Telehealth: Payer: Self-pay

## 2012-07-21 NOTE — Telephone Encounter (Signed)
Pt wants to know if along with the Adderall XR if she could also get a short acting Adderall (possibly 20mg ) to take when she has to work nights and weekends. If she tries to take a second Adderall XR it keeps her up all night. Please send back to me with reply/advise. Thanks [employer]

## 2012-07-21 NOTE — Telephone Encounter (Signed)
Pt and I discussed.  She will try to not take Adderall on days she does not work.  We can try a short acting but am afraid that she will gets ups and downs from it like she did when she started the medication.  Pt is going to schedule an appt an we are going to try to decrease her dose but for now she will not take a double dose and she will not take it on days she does not have to be at work.

## 2012-08-20 ENCOUNTER — Other Ambulatory Visit: Payer: Self-pay

## 2012-09-02 ENCOUNTER — Telehealth: Payer: Self-pay | Admitting: *Deleted

## 2012-09-02 DIAGNOSIS — F988 Other specified behavioral and emotional disorders with onset usually occurring in childhood and adolescence: Secondary | ICD-10-CM

## 2012-09-02 MED ORDER — AMPHETAMINE-DEXTROAMPHET ER 25 MG PO CP24: 25.0000 mg | ORAL_CAPSULE | ORAL | Status: DC

## 2012-09-02 NOTE — Telephone Encounter (Signed)
In order to change Adderall dosage needs an OV.

## 2012-09-02 NOTE — Telephone Encounter (Signed)
Patient requests refill on Adderall and would like prescription for regular Adderall for weekends and short days of work.

## 2012-09-03 DIAGNOSIS — R87810 Cervical high risk human papillomavirus (HPV) DNA test positive: Secondary | ICD-10-CM

## 2012-09-03 HISTORY — DX: Cervical high risk human papillomavirus (HPV) DNA test positive: R87.810

## 2012-09-07 ENCOUNTER — Ambulatory Visit: Payer: 59 | Admitting: Physician Assistant

## 2012-09-13 ENCOUNTER — Other Ambulatory Visit (HOSPITAL_COMMUNITY)
Admission: RE | Admit: 2012-09-13 | Discharge: 2012-09-13 | Disposition: A | Discharge status: Home / Self Care | Payer: 59 | Source: Ambulatory Visit | Attending: Gynecology | Admitting: Gynecology

## 2012-09-13 ENCOUNTER — Encounter: Payer: Self-pay | Admitting: Gynecology

## 2012-09-13 ENCOUNTER — Ambulatory Visit (INDEPENDENT_AMBULATORY_CARE_PROVIDER_SITE_OTHER): Payer: 59 | Admitting: Gynecology

## 2012-09-13 VITALS — BP 124/78 | Ht 64.5 in | Wt 167.0 lb

## 2012-09-13 DIAGNOSIS — R87810 Cervical high risk human papillomavirus (HPV) DNA test positive: Secondary | ICD-10-CM | POA: Insufficient documentation

## 2012-09-13 DIAGNOSIS — N6089 Other benign mammary dysplasias of unspecified breast: Secondary | ICD-10-CM

## 2012-09-13 DIAGNOSIS — Z01419 Encounter for gynecological examination (general) (routine) without abnormal findings: Secondary | ICD-10-CM | POA: Insufficient documentation

## 2012-09-13 DIAGNOSIS — N6081 Other benign mammary dysplasias of right breast: Secondary | ICD-10-CM

## 2012-09-13 DIAGNOSIS — Z1151 Encounter for screening for human papillomavirus (HPV): Secondary | ICD-10-CM | POA: Insufficient documentation

## 2012-09-13 DIAGNOSIS — B009 Herpesviral infection, unspecified: Secondary | ICD-10-CM

## 2012-09-13 MED ORDER — DESOGESTREL-ETHINYL ESTRADIOL 0.15-30 MG-MCG PO TABS: 1.0000 | ORAL_TABLET | Freq: Every day | ORAL | Status: DC

## 2012-09-13 MED ORDER — VALACYCLOVIR HCL 1 G PO TABS: 1000.0000 mg | ORAL_TABLET | Freq: Two times a day (BID) | ORAL | Status: DC

## 2012-09-13 NOTE — Patient Instructions (Signed)
Followup in one year for annual exam. Sooner if any issues. 

## 2012-09-13 NOTE — Progress Notes (Signed)
Alexis French 1976-05-29 [id number]        37 y.o.  G1P1 for annual exam.  Several issues noted below.  Past medical history,surgical history, medications, allergies, family history and social history were all reviewed and documented in the EPIC chart. ROS:  Was performed and pertinent positives and negatives are included in the history.  Exam: Kim assistant Filed Vitals:   09/13/12 1505  BP: 124/78  Height: 5' 4.5" (1.638 m)  Weight: 167 lb (75.751 kg)   General appearance  Normal Skin grossly normal with small cutaneous nodule left elbow 4 mm freely mobile no overlying skin changes Head/Neck normal with no cervical or supraclavicular adenopathy thyroid normal Lungs  clear Cardiac RR, without RMG Abdominal  soft, nontender, without masses, organomegaly or hernia Breasts  examined lying and sitting. Left without masses, retractions or axillary adenopathy. Clear galactorrhea noted with expression. Right with small cutaneous 3 mm nodule 8:00 position 2 finger breaths at the areola. No nipple discharge axillary adenopathy or other masses. Pelvic  Ext/BUS/vagina  normal   Cervix  normal Pap/HPV  Uterus  anteverted, normal size, shape and contour, midline and mobile nontender   Adnexa  Without masses or tenderness    Anus and perineum  normal   Rectovaginal  normal sphincter tone without palpated masses or tenderness.    Assessment/Plan:  37 y.o. G1P1 female for annual exam.   1. Small cutaneous nodule right breast. Had mammogram ultrasound last year which was consistent with a benign epidermal cyst. Has remained unchanged from her exam in she'll continue to follow.  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 Recommended repeat mammogram per radiology was at age 89. SBE monthly reviewed with her. Galactorrhea reportedly bilateral clear never bloody since childbirth. Only with expression. We'll continue to monitor. Patient has reported separately. 2. Pap smear. Pap/HPV  today. History of ascus negative high risk HPV last year. History of abnormal Pap smear with colposcopy in her 78s with no treatment rendered and normal Pap smears until recently. We'll triage based on Pap smear result. 3. desogestrel containing BCPs.  I reviewed the increased risk of blood clots to include stroke heart attack DVT with BCPs in general, possibly higher with desogestrel. Is being followed for hypertension on hydrochlorothiazide. Does not smoke. Had tried IUD in the past with bad experience. Patient wants to continue the pills this also helps with her acne and irregular bleeding pattern. Wants to do it every 2-3 months withdrawal. Discussed off brand labeling issues. Will refill her pills x1 year. 4. Small 3-4 mm cutaneous nodule left elbow. Noticed recently. Has not changed to self-exam and is not tender.  Recommend observation. His motion remains unchanged we'll follow. Enlarging she knows to represent for excision. 5. History of recurrent HSV. Takes Valtrex 1 g twice a day x1 or 2 days when she feels a tingle and it aborts an outbreak. She gets 180 at a time and I refilled for the year. 6. Health maintenance. Patient sees Benny Lennert and is making an appointment to see her for health maintenance and lab work. No lab work done today. Followup one year, sooner as needed.    Dara Lords MD, 3:50 PM 09/13/2012

## 2012-09-15 ENCOUNTER — Ambulatory Visit (INDEPENDENT_AMBULATORY_CARE_PROVIDER_SITE_OTHER): Payer: 59 | Admitting: Physician Assistant

## 2012-09-15 VITALS — BP 132/82 | HR 78 | Temp 98.6°F | Resp 16 | Ht 64.5 in | Wt 165.0 lb

## 2012-09-15 DIAGNOSIS — G4700 Insomnia, unspecified: Secondary | ICD-10-CM

## 2012-09-15 DIAGNOSIS — F988 Other specified behavioral and emotional disorders with onset usually occurring in childhood and adolescence: Secondary | ICD-10-CM

## 2012-09-15 MED ORDER — ALPRAZOLAM 1 MG PO TABS: 1.0000 mg | ORAL_TABLET | Freq: Two times a day (BID) | ORAL | Status: DC | PRN

## 2012-09-15 MED ORDER — ESCITALOPRAM OXALATE 10 MG PO TABS: 10.0000 mg | ORAL_TABLET | Freq: Every day | ORAL | Status: DC

## 2012-09-15 MED ORDER — AMPHETAMINE-DEXTROAMPHETAMINE 10 MG PO TABS: 10.0000 mg | ORAL_TABLET | Freq: Every day | ORAL | Status: DC

## 2012-09-15 MED ORDER — BUSPIRONE HCL 10 MG PO TABS: 10.0000 mg | ORAL_TABLET | Freq: Two times a day (BID) | ORAL | Status: DC

## 2012-09-15 NOTE — Progress Notes (Signed)
Subjective:    Patient ID: Alexis French, female    DOB: 19-May-1976, 37 y.o.   MRN: [id number]  HPI Here with multiple problems 1- feels like allergy symptoms since starting to work here about 2 years ago.  She has h/o seasonal allergies but recently it seems to be all the time.  Seems to be worse at work.  She has tried Allegra/Allegra D, Zyrtec, Singulair, and a nasal spray but she is uncertain which one. 2- bilateral elbow pain - she has on/off - while talking about this the patient then started to talk about back pain, feet pain, leg pain.  I listened and then asked the patient if she was depressed.  She immediately started to cry.  She has been down and irritable and anxious for a while and she is under an extreme amount of stress with work and home and feels like she cannot take it much more.  She has always blamed her feelings on her stress and situation but when asked direct questions she does not deny depressed mood or thoughts but never really felt that was the problem.  She is a single mom or 2 teenage boys (twins) without any family support (though she states that she is not sure being around them could be helpful and it would probably cause her more stress) she does have 1 really good friend locally who is supportive of her.  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  She would like to go to LPN school and then RN after that.  She knows she is irritable, she gets upset with herself because she yells at her kids more than she talks to them.  She never sleeps, she eats all the time because she cannot get full.  She is not interested in counseling because she does not want to talk about her problems.  She has been on a variety of antidepressant in the past some of  which did not work others gave her SE.  She has family history of "hormone" disorders but when questioned tells me they are all crazy.  She has has thoughts of running her car off the road but does not think she will do it because of her boys and she is not currently having those thoughts but she is really down.  She has moments where she can boost herself up but then she comes crashing down.  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  She wants to do a good job at work but she feels like she just does not care about anything.  Her focus is gone she would like to discuss a change in her Adderall and at the same time she is falling asleep when she should not be while working.  The Adderall XR seems to keep her up on the days she does not work because she takes it later.  At the end 'I do not know whats wrong I have been like this a long time and I just want help because I do not want to keep feeling like this'  She has never had any episodes of mania that she is aware of.  She has tried a lot for sleep - sleep hygiene is ok.  Pt has been using old Buspar irregularly for the last month or so as well as xanax which helps calm her nerves but she is starting to need this more often.  Past meds Celexa/lexapro - ? Help - a long time ago Paxil - really helped with her mood but sexually SE and she is not willing to have that Cymbalta helped but sexually SE Wellbutrin - SE but cannot remember what Ambien/Ambien CR - no help to get though the night Trazodone - to groggy the next day   Review of Systems  Musculoskeletal: Positive for myalgias and back pain. Negative for gait problem.  Allergic/Immunologic: Positive for environmental allergies.  Psychiatric/Behavioral: Positive for suicidal ideas (not currently), sleep disturbance, dysphoric mood and decreased concentration. Negative for  self-injury. The patient is nervous/anxious.        Objective:   Physical Exam  Vitals reviewed. Constitutional: She is oriented to person, place, and time. She appears well-developed and well-nourished.  Tearful almost the entire visit.  HENT:  Head: Normocephalic and atraumatic.  Right Ear: External ear normal.  Left Ear: External ear normal.  Eyes: Conjunctivae are normal.  Pulmonary/Chest: Effort normal.  Neurological: She is alert and oriented to person, place, and time.  Skin: Skin is warm and dry.  Psychiatric: Her speech is normal and behavior is normal. Judgment and thought content normal. Her mood appears not anxious. Her affect is not angry and not inappropriate. Cognition and memory are normal. She exhibits a depressed mood.       Assessment & Plan:  Insomnia - I really think that the patient is experiencing sever depression that is being aggravated by her situation.  I think that she has had this for a while esp with the numerous meds she has tried but I am concerned that it is possible she also has a mood disorder and though she has not ever had a true manic episode she goes down so quickly I wonder if a mild form of bipolar is possible esp since we do not know true diagnoses of family members.  We will try a SSRI and continue on the Buspar because the patient feels like she is getting some help with that.  We will continue her Xanax for anxiety and sleep.  Pt will think about ways she can improve her situation, make a plan for work changes etc.  Plan: escitalopram (LEXAPRO) 10 MG tablet, busPIRone (BUSPAR) 10 MG tablet, ALPRAZolam (XANAX) 1 MG tablet  ADD (attention deficit disorder) -Pt and I discussed that due to her likely depression that it will be hard for her ADD to be effectively treated esp with sleep problems.  We will add Adderall 10mg  for days she does not work because the XR keeps her up at night because she tries to sleep in and then takes it later. She should not  need her meds as often with this plan.  Plan: amphetamine-dextroamphetamine (ADDERALL) 10 MG tablet  Much of the patients 1.5hr visit was counseling.  She understands and agrees to the above plan and will recheck in 1 month.  She understands that it may take 4-6 wks to see improvement and we may need outside help for the ultimate diagnosis.

## 2012-09-19 ENCOUNTER — Other Ambulatory Visit (HOSPITAL_COMMUNITY): Payer: Self-pay | Admitting: Family Medicine

## 2012-09-19 DIAGNOSIS — M25562 Pain in left knee: Secondary | ICD-10-CM

## 2012-09-22 ENCOUNTER — Encounter: Payer: Self-pay | Admitting: Gynecology

## 2012-09-26 ENCOUNTER — Ambulatory Visit (HOSPITAL_COMMUNITY): Payer: 59

## 2012-10-12 ENCOUNTER — Ambulatory Visit (INDEPENDENT_AMBULATORY_CARE_PROVIDER_SITE_OTHER): Payer: 59 | Admitting: Physician Assistant

## 2012-10-12 VITALS — BP 130/92 | HR 86 | Temp 98.0°F | Resp 16 | Ht 64.5 in | Wt 164.0 lb

## 2012-10-12 DIAGNOSIS — G4700 Insomnia, unspecified: Secondary | ICD-10-CM

## 2012-10-12 MED ORDER — BUSPIRONE HCL 10 MG PO TABS: 10.0000 mg | ORAL_TABLET | Freq: Two times a day (BID) | ORAL | Status: DC

## 2012-10-12 MED ORDER — ESCITALOPRAM OXALATE 10 MG PO TABS: 10.0000 mg | ORAL_TABLET | Freq: Every day | ORAL | Status: DC

## 2012-10-12 NOTE — Progress Notes (Signed)
   [address], [place] [address]   Phone [phone]  Subjective:    Patient ID: Alexis French, female    DOB: 02-Sep-1975, 37 y.o.   MRN: [id number]  HPI  Pt presents for recheck.  She is doing much better.  She is still not sleeping at night but she is mentally much better.  She no longer needs to Xanax - she is taking Buspar and her Lexapro and feels better than she has since she she had her kids.  She has been checking her BP and it is running high - she feels like it has been high since she started on the Lexapro and Buspar.  She has not been taking her Adderall because her focus is overall better.    Review of Systems  Psychiatric/Behavioral: Positive for dysphoric mood (improved) and decreased concentration (much improved). The patient is nervous/anxious (much improved).        Objective:   Physical Exam  Constitutional: She is oriented to person, place, and time. She appears well-developed and well-nourished.  HENT:  Head: Normocephalic and atraumatic.  Right Ear: External ear normal.  Left Ear: External ear normal.  Cardiovascular: Normal rate, regular rhythm and normal heart sounds.   No murmur heard. Pulmonary/Chest: Effort normal and breath sounds normal.  Neurological: She is alert and oriented to person, place, and time.  Skin: Skin is warm and dry.  Psychiatric: She has a normal mood and affect. Her behavior is normal. Judgment and thought content normal.      Assessment & Plan:  Insomnia - Plan: escitalopram (LEXAPRO) 10 MG tablet, busPIRone (BUSPAR) 10 MG tablet  Pt anxiety and depression is much better on her medications. Pt will continue to monitor her BP and if running high she will need to be started on medications.  She will monitor her BP and recheck with me if it is running high.  She has been on the Micardis in the past which helped her in the past.

## 2012-10-13 ENCOUNTER — Telehealth: Payer: Self-pay

## 2012-10-13 MED ORDER — SYNTHROID 150 MCG PO TABS: 150.0000 ug | ORAL_TABLET | Freq: Every day | ORAL | Status: DC

## 2012-10-13 NOTE — Telephone Encounter (Signed)
Alexis French, pt needs a RF of the Synthroid (brand name ) #90 please. Thanks.

## 2012-10-28 ENCOUNTER — Other Ambulatory Visit: Payer: Self-pay | Admitting: Physician Assistant

## 2012-10-28 MED ORDER — TELMISARTAN 40 MG PO TABS: 40.0000 mg | ORAL_TABLET | Freq: Every day | ORAL | Status: DC

## 2012-10-28 NOTE — Telephone Encounter (Signed)
Pt BP is 170/106 - she needs BP med initiation.  Will start meds and she will monitor Bp for 1 month if still high recheck in 2 wks but if good ok to recheck in 1 month.

## 2012-11-08 ENCOUNTER — Other Ambulatory Visit: Payer: Self-pay | Admitting: Radiology

## 2012-11-08 MED ORDER — HYDROCHLOROTHIAZIDE 25 MG PO TABS: ORAL_TABLET | ORAL | Status: DC

## 2012-11-08 NOTE — Telephone Encounter (Signed)
Patient advised to follow up for this, before this medication runs out.

## 2012-11-25 ENCOUNTER — Ambulatory Visit (INDEPENDENT_AMBULATORY_CARE_PROVIDER_SITE_OTHER): Payer: 59 | Admitting: Physician Assistant

## 2012-11-25 ENCOUNTER — Encounter: Payer: Self-pay | Admitting: Physician Assistant

## 2012-11-25 VITALS — BP 112/72 | HR 87 | Temp 98.7°F | Resp 16 | Ht 64.5 in | Wt 170.0 lb

## 2012-11-25 DIAGNOSIS — N898 Other specified noninflammatory disorders of vagina: Secondary | ICD-10-CM

## 2012-11-25 DIAGNOSIS — B373 Candidiasis of vulva and vagina: Secondary | ICD-10-CM

## 2012-11-25 DIAGNOSIS — N760 Acute vaginitis: Secondary | ICD-10-CM

## 2012-11-25 DIAGNOSIS — B3731 Acute candidiasis of vulva and vagina: Secondary | ICD-10-CM

## 2012-11-25 LAB — POCT WET PREP WITH KOH
KOH Prep POC: POSITIVE
Yeast Wet Prep HPF POC: POSITIVE

## 2012-11-25 MED ORDER — METRONIDAZOLE 500 MG PO TABS: 500.0000 mg | ORAL_TABLET | Freq: Two times a day (BID) | ORAL | Status: AC

## 2012-11-25 MED ORDER — FLUCONAZOLE 150 MG PO TABS: 150.0000 mg | ORAL_TABLET | Freq: Once | ORAL | Status: DC

## 2012-11-25 NOTE — Progress Notes (Signed)
   [address] [place] [address]   Phone [phone]   Subjective:    Patient ID: Alexis French, female    DOB: 12-31-1975, 37 y.o.   MRN: [id number]  HPI  Pt presents to clinic with vaginal irritation and redness with swelling.  She is also having some vaginal discharge.  She just recently became sexually active with a new partner (who has tested neg for STDs).  Right afterwards she started to develop symptoms.  They did not use toys.  He is circumcised and he is having no symptoms.  Review of Systems  Genitourinary: Positive for vaginal pain (irritation).       Objective:   Physical Exam  Vitals reviewed. Constitutional: She is oriented to person, place, and time. She appears well-developed and well-nourished.  HENT:  Head: Normocephalic and atraumatic.  Right Ear: External ear normal.  Left Ear: External ear normal.  Pulmonary/Chest: Effort normal.  Neurological: She is alert and oriented to person, place, and time.  Skin: Skin is warm and dry.  Psychiatric: She has a normal mood and affect. Her behavior is normal. Judgment and thought content normal.    Results for orders placed in visit on 11/25/12  POCT WET PREP WITH KOH      Result Value Range   Trichomonas, UA Negative     Clue Cells Wet Prep HPF POC tntc     Epithelial Wet Prep HPF POC neg     Yeast Wet Prep HPF POC pos     Bacteria Wet Prep HPF POC 4+     RBC Wet Prep HPF POC 2-5     WBC Wet Prep HPF POC tntc     KOH Prep POC Positive           Assessment & Plan:  Vaginal irritation - Plan: POCT Wet Prep with KOH  BV (bacterial vaginosis) - Plan: metroNIDAZOLE (FLAGYL) 500 MG tablet  Yeast vaginitis - Plan: fluconazole (DIFLUCAN) 150 MG tablet  Benny Lennert PA-C 11/25/2012 6:01 PM

## 2012-12-07 ENCOUNTER — Telehealth: Payer: Self-pay

## 2012-12-07 ENCOUNTER — Other Ambulatory Visit: Payer: Self-pay | Admitting: Physician Assistant

## 2012-12-07 MED ORDER — TELMISARTAN 40 MG PO TABS: 40.0000 mg | ORAL_TABLET | Freq: Every day | ORAL | Status: DC

## 2012-12-07 NOTE — Telephone Encounter (Signed)
Alexis French, pt has an appt with you on the 11th but needs a RF of her Micardis til then. Thanks

## 2012-12-07 NOTE — Telephone Encounter (Signed)
Sent to [facility] because I think that is what she used last time.

## 2012-12-13 ENCOUNTER — Other Ambulatory Visit (INDEPENDENT_AMBULATORY_CARE_PROVIDER_SITE_OTHER): Payer: 59

## 2012-12-13 ENCOUNTER — Other Ambulatory Visit: Payer: Self-pay | Admitting: Physician Assistant

## 2012-12-13 DIAGNOSIS — E039 Hypothyroidism, unspecified: Secondary | ICD-10-CM

## 2012-12-13 DIAGNOSIS — Z139 Encounter for screening, unspecified: Secondary | ICD-10-CM

## 2012-12-13 DIAGNOSIS — Z789 Other specified health status: Secondary | ICD-10-CM

## 2012-12-13 LAB — COMPREHENSIVE METABOLIC PANEL
AST: 15 U/L (ref 0–37)
Albumin: 4.1 g/dL (ref 3.5–5.2)
Alkaline Phosphatase: 72 U/L (ref 39–117)
BUN: 12 mg/dL (ref 6–23)
Calcium: 9.4 mg/dL (ref 8.4–10.5)
Creat: 0.94 mg/dL (ref 0.50–1.10)
Glucose, Bld: 63 mg/dL — ABNORMAL LOW (ref 70–99)
Potassium: 3.8 mEq/L (ref 3.5–5.3)

## 2012-12-13 LAB — LIPID PANEL
Cholesterol: 173 mg/dL (ref 0–200)
Total CHOL/HDL Ratio: 2.4 Ratio
Triglycerides: 75 mg/dL (ref <150)
VLDL: 15 mg/dL (ref 0–40)

## 2012-12-13 LAB — POCT CBC
HCT, POC: 43.2 % (ref 37.7–47.9)
Lymph, poc: 2.4 (ref 0.6–3.4)
MCHC: 31.9 g/dL (ref 31.8–35.4)
MCV: 104.3 fL — AB (ref 80–97)
MID (cbc): 0.5 (ref 0–0.9)
POC Granulocyte: 5 (ref 2–6.9)
POC LYMPH PERCENT: 30.4 %L (ref 10–50)
Platelet Count, POC: 244 10*3/uL (ref 142–424)
RDW, POC: 12.9 %

## 2012-12-13 NOTE — Progress Notes (Unsigned)
Patient here for labs only. 

## 2012-12-14 ENCOUNTER — Ambulatory Visit (INDEPENDENT_AMBULATORY_CARE_PROVIDER_SITE_OTHER): Payer: 59 | Admitting: Physician Assistant

## 2012-12-14 ENCOUNTER — Encounter: Payer: Self-pay | Admitting: Physician Assistant

## 2012-12-14 VITALS — BP 123/87 | HR 60 | Temp 98.2°F | Resp 16 | Ht 64.5 in | Wt 170.0 lb

## 2012-12-14 DIAGNOSIS — E039 Hypothyroidism, unspecified: Secondary | ICD-10-CM

## 2012-12-14 DIAGNOSIS — G4700 Insomnia, unspecified: Secondary | ICD-10-CM

## 2012-12-14 DIAGNOSIS — I10 Essential (primary) hypertension: Secondary | ICD-10-CM

## 2012-12-14 DIAGNOSIS — Z0000 Encounter for general adult medical examination without abnormal findings: Secondary | ICD-10-CM

## 2012-12-14 DIAGNOSIS — Z111 Encounter for screening for respiratory tuberculosis: Secondary | ICD-10-CM

## 2012-12-14 MED ORDER — ESCITALOPRAM OXALATE 10 MG PO TABS: 10.0000 mg | ORAL_TABLET | Freq: Every day | ORAL | Status: DC

## 2012-12-14 MED ORDER — SYNTHROID 150 MCG PO TABS: 150.0000 ug | ORAL_TABLET | Freq: Every day | ORAL | Status: DC

## 2012-12-14 MED ORDER — HYDROCHLOROTHIAZIDE 25 MG PO TABS: ORAL_TABLET | ORAL | Status: DC

## 2012-12-14 MED ORDER — BUSPIRONE HCL 10 MG PO TABS: 10.0000 mg | ORAL_TABLET | Freq: Two times a day (BID) | ORAL | Status: DC

## 2012-12-14 NOTE — Progress Notes (Signed)
Tuberculosis Risk Questionnaire  1. Were you born outside the [place] in one of the following parts of the world: [place], [place], [place], [place] or [place]?  No  2. Have you traveled outside the [place] and lived for more than one month in one of the following parts of the world: [place], [place], [place], [place] or [place]?  No  3. Do you have a compromised immune system such as from any of the following conditions:HIV/AIDS, organ or bone marrow transplantation, diabetes, immunosuppressive medicines (e.g. Prednisone, Remicaide), leukemia, lymphoma, cancer of the head or neck, gastrectomy or jejunal bypass, end-stage renal disease (on dialysis), or silicosis?  No   4. Have you ever or do you plan on working in: a residential care center, a health care facility, a jail or prison or homeless shelter?  Yes - works in health care  5. Have you ever: injected illegal drugs, used crack cocaine, lived in a homeless shelter  or been in jail or prison?   No  6. Have you ever been exposed to anyone with infectious tuberculosis?  No   Tuberculosis Symptom Questionnaire  Do you currently have any of the following symptoms?  1. Unexplained cough lasting more than 3 weeks? No  2. Unexplained fever lasting more than 3 weeks. No   3. Night Sweats (sweating that leaves the bedclothes and sheets wet)   No  4. Shortness of Breath No  5. Chest Pain No  6. Unintentional weight loss  No  7. Unexplained fatigue (very tired for no reason) No

## 2012-12-15 DIAGNOSIS — Z789 Other specified health status: Secondary | ICD-10-CM | POA: Insufficient documentation

## 2012-12-15 NOTE — Progress Notes (Signed)
[address] [place] [address]   Phone [phone]  Subjective:    Patient ID: Alexis French, female    DOB: 03-31-1976, 37 y.o.   MRN: [id number]  HPI  Pt presents to clinic for CPE.  She is doing well.  She had her GYN exam recently.  She is tired all the time.  She anxiety and depression is much improved - she only has to use Xanax 1-3x per week.  She is noticing that she is using her Adderall not for focus but for energy - she is feeling really sleepy driving to work and scared she might fall asleep but if she takes her Adderall she gets a burst of energy.  She is back in school and her work schedule is working better for her.  She has bad sleep habits - sleeps with TV, plays on her phone in bed - wakes up about the same time every morning but if she does not have to go anywhere she naps and lounges in bed for hours.  She thinks she snores and she finds herself waking up coughing during the night.  Needs a PE form and her vaccinations for school.   Review of Systems  Constitutional: Negative.   HENT: Negative.   Eyes: Negative.   Respiratory: Negative.   Cardiovascular: Negative.   Gastrointestinal: Negative.   Endocrine: Negative.   Genitourinary: Negative.        Has an area in her L breast where she had a biopsy a little over a year ago that is still tender.  Musculoskeletal: Negative.   Skin: Negative.   Allergic/Immunologic: Negative.   Neurological: Negative.   Hematological: Negative.   Psychiatric/Behavioral: Negative for confusion, dysphoric mood and decreased concentration. The patient is nervous/anxious (at times - significantly better).        Objective:   Physical Exam  Vitals reviewed. Constitutional: She is oriented to person, place, and time. She appears well-developed and well-nourished.  HENT:  Head: Normocephalic and atraumatic.  Right Ear: Hearing, tympanic membrane, external ear and ear canal normal.  Left Ear: Hearing, tympanic membrane, external  ear and ear canal normal.  Nose: Nose normal.  Mouth/Throat: Uvula is midline, oropharynx is clear and moist and mucous membranes are normal.  Eyes: Conjunctivae and EOM are normal. Pupils are equal, round, and reactive to light.  Neck: Neck supple. No thyromegaly present.  Cardiovascular: Normal rate, regular rhythm and normal heart sounds.   No murmur heard. Pulmonary/Chest: Effort normal and breath sounds normal.  Abdominal: Soft. Bowel sounds are normal.  Musculoskeletal: Normal range of motion.  Lymphadenopathy:    She has no cervical adenopathy.  Neurological: She is alert and oriented to person, place, and time. She has normal reflexes.  Skin: Skin is warm and dry.  Psychiatric: She has a normal mood and affect. Her behavior is normal. Judgment and thought content normal.    Results for orders placed in visit on 12/13/12  LIPID PANEL      Result Value Range   Cholesterol 173  0 - 200 mg/dL   Triglycerides 75  <865 mg/dL   HDL 72  >78 mg/dL   Total CHOL/HDL Ratio 2.4     VLDL 15  0 - 40 mg/dL   LDL Cholesterol 86  0 - 99 mg/dL  COMPREHENSIVE METABOLIC PANEL      Result Value Range   Sodium 136  135 - 145 mEq/L   Potassium 3.8  3.5 - 5.3 mEq/L  Chloride 102  96 - 112 mEq/L   CO2 26  19 - 32 mEq/L   Glucose, Bld 63 (*) 70 - 99 mg/dL   BUN 12  6 - 23 mg/dL   Creat 4.09  8.11 - 9.14 mg/dL   Total Bilirubin 0.5  0.3 - 1.2 mg/dL   Alkaline Phosphatase 72  39 - 117 U/L   AST 15  0 - 37 U/L   ALT 10  0 - 35 U/L   Total Protein 7.3  6.0 - 8.3 g/dL   Albumin 4.1  3.5 - 5.2 g/dL   Calcium 9.4  8.4 - 78.2 mg/dL  TSH      Result Value Range   TSH 2.755  0.350 - 4.500 uIU/mL  MAGNESIUM      Result Value Range   Magnesium 1.9  1.5 - 2.5 mg/dL  POCT CBC      Result Value Range   WBC 7.9  4.6 - 10.2 K/uL   Lymph, poc 2.4  0.6 - 3.4   POC LYMPH PERCENT 30.4  10 - 50 %L   MID (cbc) 0.5  0 - 0.9   POC MID % 6.7  0 - 12 %M   POC Granulocyte 5.0  2 - 6.9   Granulocyte percent  62.9  37 - 80 %G   RBC 4.14  4.04 - 5.48 M/uL   Hemoglobin 13.8  12.2 - 16.2 g/dL   HCT, POC 95.6  21.3 - 47.9 %   MCV 104.3 (*) 80 - 97 fL   MCH, POC 33.3 (*) 27 - 31.2 pg   MCHC 31.9  31.8 - 35.4 g/dL   RDW, POC 08.6     Platelet Count, POC 244  142 - 424 K/uL   MPV 8.7  0 - 99.8 fL         Assessment & Plan:  CPE - screening labs were reviewed with patient.  Screening -pulmonary TB - Plan: TB Skin Test - pt to RTC for TB reading in 48-72h  Insomnia - Continue current meds - if she notices that things are starting to get out of control either of these meds can be increased but for now pt will plan on improving sleep hygiene and if that does not help with fatigue will consider a sleep study. Plan: escitalopram (LEXAPRO) 10 MG tablet, busPIRone (BUSPAR) 10 MG tablet  Unspecified hypothyroidism -Continue current dose - Plan: SYNTHROID 150 MCG tablet  HTN (hypertension) - pt to continue to monitor - she will monitor her BP and stay off the micardis for the next week if her BP stays under control then she will not need that meds. Plan: hydrochlorothiazide (HYDRODIURIL) 25 MG tablet  We will get a copy of her vaccines and then finish filling out her school form.  Benny Lennert PA-C 12/15/2012 1:25 PM

## 2012-12-17 ENCOUNTER — Ambulatory Visit (INDEPENDENT_AMBULATORY_CARE_PROVIDER_SITE_OTHER): Payer: 59

## 2012-12-17 DIAGNOSIS — Z111 Encounter for screening for respiratory tuberculosis: Secondary | ICD-10-CM

## 2013-01-10 ENCOUNTER — Telehealth: Payer: Self-pay

## 2013-01-10 DIAGNOSIS — G4700 Insomnia, unspecified: Secondary | ICD-10-CM

## 2013-01-10 NOTE — Telephone Encounter (Signed)
Alexis French, can pt get a RF of Lexapro 20mg  #90 (She's been taking 2 10mg  tabs a day and is about to run out and would like a 90d supply if possible before ins runs out) and a RF of her Buspar 10mg . Thanks

## 2013-01-11 MED ORDER — ESCITALOPRAM OXALATE 20 MG PO TABS: 20.0000 mg | ORAL_TABLET | Freq: Every day | ORAL | Status: DC

## 2013-01-11 MED ORDER — BUSPIRONE HCL 10 MG PO TABS: 10.0000 mg | ORAL_TABLET | Freq: Two times a day (BID) | ORAL | Status: DC

## 2013-01-11 NOTE — Telephone Encounter (Signed)
Sent to pharmacy - I hope new job is going great.

## 2013-01-20 ENCOUNTER — Telehealth: Payer: Self-pay

## 2013-01-20 MED ORDER — DESOGESTREL-ETHINYL ESTRADIOL 0.15-30 MG-MCG PO TABS: ORAL_TABLET | ORAL | Status: DC

## 2013-01-20 NOTE — Telephone Encounter (Signed)
I received a message that pharmacy would like a new Rx reflecting that patient takes active pills continuously so that her insurance will cover the correctly.

## 2013-01-29 ENCOUNTER — Telehealth: Payer: Self-pay

## 2013-01-29 DIAGNOSIS — F988 Other specified behavioral and emotional disorders with onset usually occurring in childhood and adolescence: Secondary | ICD-10-CM

## 2013-01-29 NOTE — Telephone Encounter (Signed)
Pt would like a RF of her Adderall 10mg  and wants to know if she can get it for BID. She begins school again in a few weeks

## 2013-01-30 MED ORDER — AMPHETAMINE-DEXTROAMPHETAMINE 10 MG PO TABS: 10.0000 mg | ORAL_TABLET | Freq: Every day | ORAL | Status: DC

## 2013-01-30 NOTE — Telephone Encounter (Signed)
I think we should start at once a day.

## 2013-04-20 ENCOUNTER — Other Ambulatory Visit: Payer: Self-pay | Admitting: Physician Assistant

## 2013-04-20 DIAGNOSIS — F988 Other specified behavioral and emotional disorders with onset usually occurring in childhood and adolescence: Secondary | ICD-10-CM

## 2013-04-23 ENCOUNTER — Telehealth: Payer: Self-pay

## 2013-04-23 DIAGNOSIS — G4700 Insomnia, unspecified: Secondary | ICD-10-CM

## 2013-04-23 MED ORDER — ESCITALOPRAM OXALATE 20 MG PO TABS: 20.0000 mg | ORAL_TABLET | Freq: Every day | ORAL | Status: DC

## 2013-04-23 NOTE — Telephone Encounter (Signed)
Pt needs a RF of her Prozac-has been out a few days. Can you please send to [facility] on [address] in [place]. Thanks

## 2013-04-23 NOTE — Telephone Encounter (Signed)
Rx sent to pharmacy   

## 2013-04-24 ENCOUNTER — Other Ambulatory Visit: Payer: Self-pay | Admitting: Radiology

## 2013-04-24 DIAGNOSIS — E039 Hypothyroidism, unspecified: Secondary | ICD-10-CM

## 2013-04-24 DIAGNOSIS — I10 Essential (primary) hypertension: Secondary | ICD-10-CM

## 2013-04-24 MED ORDER — HYDROCHLOROTHIAZIDE 25 MG PO TABS: ORAL_TABLET | ORAL | Status: DC

## 2013-04-24 MED ORDER — SYNTHROID 150 MCG PO TABS: 150.0000 ug | ORAL_TABLET | Freq: Every day | ORAL | Status: DC

## 2013-04-25 MED ORDER — AMPHETAMINE-DEXTROAMPHETAMINE 10 MG PO TABS
10.0000 mg | ORAL_TABLET | Freq: Every day | ORAL | Status: DC
Start: 2013-04-20 — End: 2013-05-01

## 2013-05-01 ENCOUNTER — Ambulatory Visit (INDEPENDENT_AMBULATORY_CARE_PROVIDER_SITE_OTHER): Payer: BC Managed Care – PPO | Admitting: Physician Assistant

## 2013-05-01 VITALS — BP 122/76 | HR 99 | Temp 98.1°F | Resp 16 | Ht 64.5 in | Wt 194.0 lb

## 2013-05-01 DIAGNOSIS — E039 Hypothyroidism, unspecified: Secondary | ICD-10-CM

## 2013-05-01 DIAGNOSIS — E66811 Obesity, class 1: Secondary | ICD-10-CM

## 2013-05-01 DIAGNOSIS — E669 Obesity, unspecified: Secondary | ICD-10-CM | POA: Insufficient documentation

## 2013-05-01 DIAGNOSIS — G4700 Insomnia, unspecified: Secondary | ICD-10-CM

## 2013-05-01 DIAGNOSIS — F988 Other specified behavioral and emotional disorders with onset usually occurring in childhood and adolescence: Secondary | ICD-10-CM

## 2013-05-01 MED ORDER — AMPHETAMINE-DEXTROAMPHET ER 10 MG PO CP24: 10.0000 mg | ORAL_CAPSULE | ORAL | Status: DC

## 2013-05-01 MED ORDER — BUSPIRONE HCL 10 MG PO TABS: 10.0000 mg | ORAL_TABLET | Freq: Two times a day (BID) | ORAL | Status: DC

## 2013-05-01 NOTE — Progress Notes (Signed)
   [address], [place] [address]   Phone [phone]  Subjective:    Patient ID: Alexis French, female    DOB: June 22, 1976, 37 y.o.   MRN: [id number]  HPI Pt presents to clinic for med recheck.  She thinks it is time for her thyroid to be check - it has been 4 months.  She feels good overall. She is a little disappointed with the fact that she has been gaining weight but she knows that she has not been able to exercise with her work schedule and her student status.  She feels like she is doing well mentally - her current meds are working and her temper is improved and she "feels like her life is back"  She is not wanting to use ETOH every day as a way to deal with things. She is having trouble with her focus.  She feels like the Adderall 10mg  is a good dose when it is working but she does not feel like it lasts long enough.  She is concerned about bid dosing because she has had the "roll coaster" med effects in the past.  She is still having leg swelling but only on days where she is standing up for long periods of time.  Her swelling is completely gone in the morning and is not bad on days that she is not standing for long periods of time.  Review of Systems  Respiratory: Negative for shortness of breath.   Cardiovascular: Positive for leg swelling. Negative for chest pain.  Psychiatric/Behavioral: Positive for decreased concentration.       Objective:   Physical Exam  Vitals reviewed. Constitutional: She is oriented to person, place, and time. She appears well-developed and well-nourished.  HENT:  Head: Normocephalic and atraumatic.  Right Ear: External ear normal.  Left Ear: External ear normal.  Eyes: Conjunctivae are normal.  Neck: Normal range of motion.  Pulmonary/Chest: Effort normal.  Neurological: She is alert and oriented to person, place, and time.  Skin: Skin is warm and dry.  Psychiatric: She has a normal mood and affect. Her behavior is normal. Judgment and thought  content normal.          Assessment & Plan:  Unspecified hypothyroidism - Check labs tonight - will fill meds based on her lab results. lan: TSH  ADD (attention deficit disorder) - Due to her student and working status - we will change to Adderall Xr to see if her medication coverage is better.  Plan: amphetamine-dextroamphetamine (ADDERALL XR) 10 MG 24 hr capsule  Anxiety/depression - Continue current medications. Plan: busPIRone (BUSPAR) 10 MG tablet  Obesity, Class I, BMI 30-34.9 - pt will continue to count calories and monitor her foodintake because of her lack of time for exercise.  Recheck in 3 months  Benny Lennert [facility] 05/01/2013 7:16 PM

## 2013-05-02 ENCOUNTER — Ambulatory Visit (INDEPENDENT_AMBULATORY_CARE_PROVIDER_SITE_OTHER): Payer: BC Managed Care – PPO

## 2013-05-02 DIAGNOSIS — Z23 Encounter for immunization: Secondary | ICD-10-CM

## 2013-05-02 LAB — TSH: TSH: 4.155 u[IU]/mL (ref 0.350–4.500)

## 2013-05-04 ENCOUNTER — Other Ambulatory Visit: Payer: Self-pay | Admitting: Physician Assistant

## 2013-05-04 DIAGNOSIS — E039 Hypothyroidism, unspecified: Secondary | ICD-10-CM

## 2013-05-04 MED ORDER — SYNTHROID 150 MCG PO TABS: 150.0000 ug | ORAL_TABLET | Freq: Every day | ORAL | Status: DC

## 2013-06-30 ENCOUNTER — Other Ambulatory Visit: Payer: Self-pay | Admitting: Physician Assistant

## 2013-07-20 ENCOUNTER — Telehealth: Payer: Self-pay

## 2013-07-20 DIAGNOSIS — F988 Other specified behavioral and emotional disorders with onset usually occurring in childhood and adolescence: Secondary | ICD-10-CM

## 2013-07-20 DIAGNOSIS — I10 Essential (primary) hypertension: Secondary | ICD-10-CM

## 2013-07-20 MED ORDER — HYDROCHLOROTHIAZIDE 25 MG PO TABS: ORAL_TABLET | ORAL | Status: DC

## 2013-07-20 MED ORDER — AMPHETAMINE-DEXTROAMPHET ER 10 MG PO CP24: 10.0000 mg | ORAL_CAPSULE | ORAL | Status: DC

## 2013-07-20 NOTE — Telephone Encounter (Signed)
Written.

## 2013-07-20 NOTE — Telephone Encounter (Signed)
Pt needs a RF of her adderall and HCT please and thanks

## 2013-07-27 ENCOUNTER — Other Ambulatory Visit: Payer: Self-pay | Admitting: Physician Assistant

## 2013-07-27 ENCOUNTER — Telehealth: Payer: Self-pay

## 2013-07-27 DIAGNOSIS — I10 Essential (primary) hypertension: Secondary | ICD-10-CM

## 2013-07-27 MED ORDER — HYDROCHLOROTHIAZIDE 25 MG PO TABS: ORAL_TABLET | ORAL | Status: DC

## 2013-07-27 MED ORDER — ESCITALOPRAM OXALATE 20 MG PO TABS: 20.0000 mg | ORAL_TABLET | Freq: Every day | ORAL | Status: DC

## 2013-07-27 NOTE — Telephone Encounter (Signed)
Pt needs a RF of her Lexapro and HCTZ and needs a 90 day supply of each.

## 2013-08-10 ENCOUNTER — Other Ambulatory Visit: Payer: Self-pay | Admitting: Physician Assistant

## 2013-08-10 DIAGNOSIS — G4700 Insomnia, unspecified: Secondary | ICD-10-CM

## 2013-08-10 MED ORDER — ALPRAZOLAM 1 MG PO TABS: 1.0000 mg | ORAL_TABLET | Freq: Two times a day (BID) | ORAL | Status: DC | PRN

## 2013-08-10 NOTE — Telephone Encounter (Signed)
Please send to the pharmacy °

## 2013-08-11 NOTE — Telephone Encounter (Signed)
faxed

## 2013-08-17 ENCOUNTER — Ambulatory Visit: Payer: Self-pay | Admitting: Physician Assistant

## 2013-08-17 VITALS — BP 132/88 | HR 80 | Temp 98.4°F | Resp 16 | Ht 64.5 in | Wt 209.0 lb

## 2013-08-17 DIAGNOSIS — F988 Other specified behavioral and emotional disorders with onset usually occurring in childhood and adolescence: Secondary | ICD-10-CM

## 2013-08-17 DIAGNOSIS — E039 Hypothyroidism, unspecified: Secondary | ICD-10-CM

## 2013-08-17 DIAGNOSIS — G4700 Insomnia, unspecified: Secondary | ICD-10-CM

## 2013-08-17 MED ORDER — BUSPIRONE HCL 15 MG PO TABS: 15.0000 mg | ORAL_TABLET | Freq: Two times a day (BID) | ORAL | Status: DC

## 2013-08-17 MED ORDER — AMPHETAMINE-DEXTROAMPHET ER 15 MG PO CP24: 15.0000 mg | ORAL_CAPSULE | ORAL | Status: DC

## 2013-08-17 NOTE — Patient Instructions (Signed)
Tennis Elbow  Your caregiver has diagnosed you with a condition often referred to as "tennis elbow." This results from small tears or soreness (inflammation) at the start (origin) of the extensor muscles of the forearm. Although the condition is often called tennis or golfer's elbow, it is caused by any repetitive action performed by your elbow.  HOME CARE INSTRUCTIONS   If the condition has been short lived, rest may be the only treatment required. Using your opposite hand or arm to perform the task may help. Even changing your grip may help rest the extremity. These may even prevent the condition from recurring.   Longer standing problems, however, will often be relieved faster by:   Using anti-inflammatory agents.   Applying ice packs for 30 minutes at the end of the working day, at bed time, or when activities are finished.   Your caregiver may also have you wear a splint or sling. This will allow the inflamed tendon to heal.  At times, steroid injections aided with a local anesthetic will be required along with splinting for 1 to 2 weeks. Two to three steroid injections will often solve the problem. In some long standing cases, the inflamed tendon does not respond to conservative (non-surgical) therapy. Then surgery may be required to repair it.  MAKE SURE YOU:    Understand these instructions.   Will watch your condition.   Will get help right away if you are not doing well or get worse.  Document Released: 06/22/2005 Document Revised: 09/14/2011 Document Reviewed: 02/08/2008  [employer] Patient Information 2014 [employer].

## 2013-08-17 NOTE — Progress Notes (Signed)
   Subjective:    Patient ID: Maryann AlarMelanie M Beauregard, female    DOB: 12/12/1975, 38 y.o.   MRN: [id number]  HPI  Pt presents to clinic for med refills.  She is doing ok.  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  She is really stressed out with work and school work and rotations.  She is not sleeping well.  She tends to nap because she is exhausted and then she stays up doing work and then she cannot sleep because her mind will not turn off.  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  She is frustrated with her weight gain - she is trying to exercise but she is just not motivated and she is to busy and when she can find the time she is to tired.  Her mood is good and she feels like during the day her anxiety is controlled but when she tries to sleep that is when she has problems.  Her hair is falling out but she has had this in the past when she is really stressed and then when things calm down it grows back.  Review of Systems  Psychiatric/Behavioral: Positive for sleep disturbance and decreased concentration. Negative for dysphoric mood. The patient is nervous/anxious.        Objective:   Physical Exam  Vitals reviewed. Constitutional: She is oriented to person, place, and time. She appears well-developed and well-nourished.  HENT:  Head: Normocephalic and atraumatic.  Right Ear: External ear normal.  Left Ear: External ear normal.  Pulmonary/Chest: Effort normal.  Neurological: She is alert and oriented to person, place, and time.  Skin: Skin is warm and dry.  Psychiatric: She has a normal mood and affect. Her behavior is normal. Judgment and thought content normal.          Assessment & Plan:  ADD (attention deficit disorder) - Plan: amphetamine-dextroamphetamine (ADDERALL XR) 15 MG 24 hr capsule - we will try an increase in  dose to see if improved focused.  Insomnia - Plan: busPIRone (BUSPAR) 15 MG tablet - will increase this dose to see if we can get her sleep improved.    Hypothyroidism - Plan: TSH  Benny LennertSarah Weber PA-C 08/21/2013 8:16 AM

## 2013-08-18 ENCOUNTER — Other Ambulatory Visit (INDEPENDENT_AMBULATORY_CARE_PROVIDER_SITE_OTHER): Payer: Self-pay

## 2013-08-18 DIAGNOSIS — E039 Hypothyroidism, unspecified: Secondary | ICD-10-CM

## 2013-08-21 ENCOUNTER — Telehealth: Payer: Self-pay | Admitting: Family Medicine

## 2013-08-21 LAB — TSH
TSH: 44.381 u[IU]/mL — AB (ref 0.350–4.500)
TSH: 27.205 u[IU]/mL — AB (ref 0.350–4.500)

## 2013-08-21 LAB — T4, FREE: FREE T4: 1.08 ng/dL (ref 0.80–1.80)

## 2013-08-21 NOTE — Telephone Encounter (Signed)
Alexis French, [facility], called regarding TSH. First TSH 08/17/13 44.381 and second TSH 08/18/13 27.205 from second blood draw. They did repeat both of these and they are both consistent with previous results. 2/12 repeat 45.993 2/13 repeat 27.296. They also did a blood type and it was A. Alexis French is not sure what to do from this point but does think its very strange her TSH was so different one day to the next with 2 different specimens. Please let her know what we would like to do next. They can send out to lab as well. She said she does agree that her TSH is high. Her number [phone]

## 2013-08-23 ENCOUNTER — Other Ambulatory Visit: Payer: Self-pay | Admitting: Physician Assistant

## 2013-08-23 DIAGNOSIS — E039 Hypothyroidism, unspecified: Secondary | ICD-10-CM

## 2013-08-29 ENCOUNTER — Ambulatory Visit: Payer: 59 | Admitting: Endocrinology

## 2013-09-05 ENCOUNTER — Telehealth: Payer: Self-pay

## 2013-09-05 NOTE — Telephone Encounter (Signed)
Pt called saying she wants a note saying that she can listen to music while studying at school. Says she talked to you about this. Please advise. Thanks

## 2013-09-06 ENCOUNTER — Telehealth: Payer: Self-pay | Admitting: Endocrinology

## 2013-09-06 ENCOUNTER — Ambulatory Visit (INDEPENDENT_AMBULATORY_CARE_PROVIDER_SITE_OTHER): Payer: Medicaid Other | Admitting: Endocrinology

## 2013-09-06 ENCOUNTER — Ambulatory Visit: Payer: 59 | Admitting: Endocrinology

## 2013-09-06 ENCOUNTER — Encounter: Payer: Self-pay | Admitting: Endocrinology

## 2013-09-06 ENCOUNTER — Other Ambulatory Visit: Payer: Self-pay | Admitting: Physician Assistant

## 2013-09-06 VITALS — BP 130/90 | HR 87 | Temp 98.7°F | Ht 64.5 in | Wt 213.0 lb

## 2013-09-06 DIAGNOSIS — G4700 Insomnia, unspecified: Secondary | ICD-10-CM

## 2013-09-06 DIAGNOSIS — E039 Hypothyroidism, unspecified: Secondary | ICD-10-CM

## 2013-09-06 MED ORDER — LEVOTHYROXINE SODIUM 200 MCG PO TABS: 200.0000 ug | ORAL_TABLET | Freq: Every day | ORAL | Status: DC

## 2013-09-06 MED ORDER — ALPRAZOLAM 1 MG PO TABS: 1.0000 mg | ORAL_TABLET | Freq: Two times a day (BID) | ORAL | Status: DC | PRN

## 2013-09-06 NOTE — Telephone Encounter (Signed)
Sent in new strength for synthroid do you want brand or generic?

## 2013-09-06 NOTE — Telephone Encounter (Signed)
Spoke with Pharmacy concerning this. Synthroid will be dispensed.

## 2013-09-06 NOTE — Progress Notes (Signed)
Subjective:    Patient ID: Alexis French, female    DOB: 09/23/1975, 38 y.o.   MRN: [id number]  HPI Pt reports hypothyroidism was dx'ed in approx 2007.  she has been on levothyroxine consistently since then.  she has never taken non-prescribed thyroid hormone therapy.  He has never taken kelp or any other type of non-prescribed thyroid product.  He has never had thyroid imaging.  She is not considering a pregnancy.  He has never had thyroid surgery, or XRT to the neck.  He has never been on amiodarone or lithium.  She has been on her current dosage of 150 mcg/day, since at least 2011.  She reports moderate hair loss, throughout the head, and assoc weight gain.  Past Medical History  Diagnosis Date  . Hypertension   . STD (sexually transmitted disease)     HSV2  . Attention deficit disorder (ADD)   . Anxiety   . Thyroid disease     Hypothyroid  . ASCUS (atypical squamous cells of undetermined significance) on Pap smear   . Allergy   . 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 Dilation and curettage of uterus  APPROX. 2006  . Colposcopy      History   Social History  . Marital Status: Single    Spouse Name: N/A    Number of Children: N/A  . Years of Education: N/A   Occupational History  . Not on file.   Social History Main Topics  . Smoking status: Never Smoker   . Smokeless tobacco: Never Used  . Alcohol Use: Yes     Comment: SOCIALLY ONLY  . Drug Use: No  . Sexual Activity: Yes    Birth Control/ Protection: Pill   Other Topics Concern  . Not on file   Social History Narrative  . No narrative on file    Current Outpatient Prescriptions on File Prior to Visit  Medication Sig Dispense Refill  . ALPRAZolam (XANAX) 1 MG tablet Take 1 tablet (1 mg total) by mouth 2 (two) times daily as needed.  30 tablet  0  .  amphetamine-dextroamphetamine (ADDERALL XR) 15 MG 24 hr capsule Take 1 capsule (15 mg total) by mouth every morning.  30 capsule  0  . b complex vitamins tablet Take 1 tablet by mouth daily.      Marland Kitchen. BIOTIN 5000 PO Take 2 capsules by mouth daily.      . busPIRone (BUSPAR) 15 MG tablet Take 1 tablet (15 mg total) by mouth 2 (two) times daily.  180 tablet  0  . desogestrel-ethinyl estradiol (APRI) 0.15-30 MG-MCG tablet Take one active tab daily continuously as directed.  4 Package  2  . escitalopram (LEXAPRO) 20 MG tablet Take 1 tablet (20 mg total) by mouth daily.  90 tablet  1  . folic acid (FOLVITE) 1 MG tablet Take 1 mg by mouth daily.      . hydrochlorothiazide (HYDRODIURIL) 25 MG tablet TAKE 1 TABLET BY MOUTH ONCE DAILY  90 tablet  1  . Multiple Vitamin (MULTIVITAMIN) tablet Take 1 tablet by mouth daily.      Marland Kitchen. SYNTHROID 150 MCG tablet Take 1 tablet (150 mcg total) by mouth daily before breakfast.  90 tablet  0  . valACYclovir (VALTREX) 1000 MG tablet Take 1 tablet (1,000 mg total) by mouth 2 (  two) times daily.  180 tablet  3   No current facility-administered medications on file prior to visit.   Allergies  Allergen Reactions  . Penicillins    Family History  Problem Relation Age of Onset  . Diabetes Mother   . Hypertension Mother   . Diabetes Sister   . Cancer Maternal Grandmother   . Cirrhosis Maternal Grandmother   . Hypertension Maternal Grandfather   . Arthritis Maternal Grandfather   . Heart attack Maternal Grandfather   . Diabetes Paternal Grandmother   . Heart attack Paternal Grandmother   . Cancer Paternal Grandmother   . Heart attack Paternal Grandfather   . Heart Problems Paternal Grandfather   mother has a goiter.  BP 130/90  Pulse 87  Temp(Src) 98.7 F (37.1 C) (Oral)  Ht 5' 4.5" (1.638 m)  Wt 213 lb (96.616 kg)  BMI 36.01 kg/m2  SpO2 99%  Review of Systems denies cramps, sob, fever, constipation, numbness, blurry vision, myalgias, and syncope.  She has  insomnia, difficulty with concentration, dry skin, rhinorrhea, easy bruising, lightheadedness, and low-back pain.      Objective:   Physical Exam VS: see vs page GEN: no distress HEAD: head: no deformity eyes: no periorbital swelling, no proptosis external nose and ears are normal mouth: no lesion seen NECK: supple, thyroid is not enlarged CHEST WALL: no deformity LUNGS:  Clear to auscultation CV: reg rate and rhythm, no murmur ABD: abdomen is soft, nontender.  no hepatosplenomegaly.  not distended.  no hernia. MUSCULOSKELETAL: muscle bulk and strength are grossly normal.  no obvious joint swelling.  gait is normal and steady. EXTEMITIES: no deformity.   no edema. PULSES: no carotid bruit NEURO:  cn 2-12 grossly intact.   readily moves all 4's.  sensation is intact to touch on all 4's. SKIN:  Normal texture and temperature.  No rash or suspicious lesion is visible.   NODES:  None palpable at the neck PSYCH: alert, well-oriented.  Does not appear anxious nor depressed.   Lab Results  Component Value Date   TSH 27.205* 08/18/2013      Assessment & Plan:  Chronic hypothyroidism: the reason for her increased synthroid requirement is unclear. Depression: this could be exac by hypothyroidism. Lightheadedness: this could also be exac by hypothyroidism.

## 2013-09-06 NOTE — Patient Instructions (Signed)
Please increase the thyroid pill.  i have sent a prescription to your pharmacy. Please redo the blood test in 1 month.   You should take the thyroid pill on an empty stomach.

## 2013-09-06 NOTE — Telephone Encounter (Signed)
Written.

## 2013-09-21 ENCOUNTER — Ambulatory Visit (INDEPENDENT_AMBULATORY_CARE_PROVIDER_SITE_OTHER): Payer: Self-pay | Admitting: Physician Assistant

## 2013-09-21 ENCOUNTER — Ambulatory Visit: Payer: Self-pay

## 2013-09-21 VITALS — BP 142/98 | HR 93 | Temp 98.3°F | Resp 16 | Ht 64.5 in | Wt 213.0 lb

## 2013-09-21 DIAGNOSIS — S39012A Strain of muscle, fascia and tendon of lower back, initial encounter: Secondary | ICD-10-CM

## 2013-09-21 DIAGNOSIS — IMO0002 Reserved for concepts with insufficient information to code with codable children: Secondary | ICD-10-CM

## 2013-09-21 DIAGNOSIS — M62838 Other muscle spasm: Secondary | ICD-10-CM

## 2013-09-21 DIAGNOSIS — M545 Low back pain, unspecified: Secondary | ICD-10-CM

## 2013-09-21 MED ORDER — TIZANIDINE HCL 4 MG PO CAPS: 4.0000 mg | ORAL_CAPSULE | Freq: Three times a day (TID) | ORAL | Status: DC | PRN

## 2013-09-21 MED ORDER — PREDNISONE 20 MG PO TABS
ORAL_TABLET | ORAL | Status: DC
Start: 2013-09-21 — End: 2014-02-05

## 2013-09-21 NOTE — Progress Notes (Signed)
Subjective:    Patient ID: Alexis French, female    DOB: 10/02/1975, 38 y.o.   MRN: [id number]  HPI Primary Physician: Arletta BaleWEBBER,SARAH, PA-C  Chief Complaint: Back pain x 4 days  HPI: 38 y.o. female with history of back pain presents with 4 day history of low back pain. No known injury or trauma. She does relate to bending and rotating a lot while folding the laundry just prior to the her back bothering her. Her pain is located just left to midline of the lower back and radiates to the mid buttock. No numbness or tingling. No weakness. Her pain had been rated at a 3 or 4 out of 10. This morning on her way to work she slipped on her wooden porch and fell most of the way down causing a flare up in her pain along the left lower back. Her pain is now a 5/10. No change in the radiation, numbness, tingling, or weakness. No loss of bowel or bladder function. She had been trying left over Robaxin, but that had not been helping. Ibuprofen was helping.     Past Medical History  Diagnosis Date  . Hypertension   . STD (sexually transmitted disease)     HSV2  . Attention deficit disorder (ADD)   . Anxiety   . Thyroid disease     Hypothyroid  . ASCUS (atypical squamous cells of undetermined significance) on Pap smear   . Allergy   . 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 Authorizing Provider  ALPRAZolam Prudy Feeler(XANAX) 1 MG tablet Take 1 tablet (1 mg total) by mouth 2 (two) times daily as needed. 09/06/13  Yes Morrell RiddleSarah L Weber, PA-C  amphetamine-dextroamphetamine (ADDERALL XR) 15 MG 24 hr capsule Take 1 capsule (15 mg total) by mouth every morning. 08/17/13  Yes Morrell RiddleSarah L Weber, PA-C  b complex vitamins tablet Take 1 tablet by mouth daily.   Yes Historical Provider, MD  BIOTIN 5000 PO Take 2 capsules by mouth daily.   Yes Historical  Provider, MD  busPIRone (BUSPAR) 15 MG tablet Take 1 tablet (15 mg total) by mouth 2 (two) times daily. 08/17/13  Yes Morrell RiddleSarah L Weber, PA-C  desogestrel-ethinyl estradiol (APRI) 0.15-30 MG-MCG tablet Take one active tab daily continuously as directed. 01/20/13  Yes Dara Lordsimothy P Fontaine, MD  escitalopram (LEXAPRO) 20 MG tablet Take 1 tablet (20 mg total) by mouth daily. 07/27/13  Yes Morrell RiddleSarah L Weber, PA-C  folic acid (FOLVITE) 1 MG tablet Take 1 mg by mouth daily.   Yes Historical Provider, MD  hydrochlorothiazide (HYDRODIURIL) 25 MG tablet TAKE 1 TABLET BY MOUTH ONCE DAILY 07/27/13  Yes Morrell RiddleSarah L Weber, PA-C  levothyroxine (SYNTHROID, LEVOTHROID) 200 MCG tablet Take 1 tablet (200 mcg total) by mouth daily before breakfast. 09/06/13  Yes Romero BellingSean Ellison, MD  Multiple Vitamin (MULTIVITAMIN) tablet Take 1 tablet by mouth daily.   Yes Historical Provider, MD  valACYclovir (VALTREX) 1000 MG tablet Take 1 tablet (1,000 mg total) by mouth 2 (two) times daily. 09/13/12  Yes Dara Lordsimothy P Fontaine, MD    Allergies:  Allergies  Allergen Reactions  . Penicillins     History   Social History  . Marital Status: Single    Spouse Name: N/A    Number of Children: N/A  . Years of Education: N/A  Occupational History  . Not on file.   Social History Main Topics  . Smoking status: Never Smoker   . Smokeless tobacco: Never Used  . Alcohol Use: 1.8 oz/week    3 Glasses of wine per week     Comment: SOCIALLY ONLY  . Drug Use: No  . Sexual Activity: Not Currently    Birth Control/ Protection: Pill   Other Topics Concern  . Not on file   Social History Narrative  . No narrative on file     Review of Systems  Constitutional: Negative for fever, chills and fatigue.  Respiratory: Negative for cough, shortness of breath and wheezing.   Gastrointestinal: Negative for vomiting, abdominal pain, diarrhea and constipation.  Musculoskeletal: Positive for back pain and myalgias. Negative for arthralgias, gait problem,  joint swelling, neck pain and neck stiffness.  Skin: Negative for color change, pallor, rash and wound.  Neurological: Negative for tremors, weakness and numbness.       Objective:   Physical Exam  Physical Exam: Blood pressure 142/98, pulse 93, temperature 98.3 F (36.8 C), temperature source Oral, resp. rate 16, height 5' 4.5" (1.638 m), weight 213 lb (96.616 kg), last menstrual period 08/24/2013, SpO2 97.00%., Body mass index is 36.01 kg/(m^2). General: Well developed, well nourished, in no acute distress. Head: Normocephalic, atraumatic, eyes without discharge, sclera non-icteric, nares are without discharge.    Neck: Supple. Full ROM.  Lungs: Clear bilaterally to auscultation without wheezes, rales, or rhonchi. Breathing is unlabored. Heart: RRR with S1 S2. No murmurs, rubs, or gallops appreciated. Msk:  Strength and tone normal for age. Back: No erythema, ecchymosis, or STS. TTP midline at L3 and left sided paraspinal muscles of L3. Otherwise there is no TTP. Decreased ROM throughout secondary to discomfort. She is able to exhibit all ranges of motion however. She notes some discomfort with bilateral SLR when seated and with right sided SLR when supine. Left sided SLR is unremarkable.  Extremities/Skin: Warm and dry. No clubbing or cyanosis. No edema. No rashes or suspicious lesions. 5/5 strength bilateral LE. Sensation equal bilateral LE.  Neuro: Alert and oriented X 3. Moves all extremities spontaneously. Gait is normal. CNII-XII grossly in tact. LE DTR 2+ bilaterally.  Psych:  Responds to questions appropriately with a normal affect.   L spine:  [facility] reading (PRIMARY) by  Dr. Alwyn Ren. Negative.     Assessment & Plan:  38 year old female with back train, LBP, and muscle spasm  -Prednisone 20 mg #18 3x3, 2x3, 1x3 no RF -Zanaflex 4 mg 1 po tid #90 RF 1 -Rest  -Out of work next 2 days and rest over the weekend -RTC precautions   Eula Listen, MHS, PA-C [facility] [address] [place], [place] [address] [phone] [facility] 09/21/2013 10:40 AM

## 2013-09-25 ENCOUNTER — Telehealth: Payer: Self-pay

## 2013-09-25 DIAGNOSIS — F988 Other specified behavioral and emotional disorders with onset usually occurring in childhood and adolescence: Secondary | ICD-10-CM

## 2013-09-25 MED ORDER — AMPHETAMINE-DEXTROAMPHET ER 15 MG PO CP24: 15.0000 mg | ORAL_CAPSULE | ORAL | Status: DC

## 2013-09-25 NOTE — Telephone Encounter (Signed)
Pt requests a RF of her Adderall please. Thanks

## 2013-09-25 NOTE — Telephone Encounter (Signed)
Done

## 2013-11-07 ENCOUNTER — Telehealth: Payer: Self-pay

## 2013-11-07 NOTE — Telephone Encounter (Signed)
Adderall RF please

## 2013-11-09 NOTE — Telephone Encounter (Signed)
Needs OV.  We discussed that this is a medication for school.

## 2013-11-16 ENCOUNTER — Telehealth: Payer: Self-pay | Admitting: *Deleted

## 2013-11-16 DIAGNOSIS — G4700 Insomnia, unspecified: Secondary | ICD-10-CM

## 2013-11-16 MED ORDER — ALPRAZOLAM 1 MG PO TABS: 1.0000 mg | ORAL_TABLET | Freq: Two times a day (BID) | ORAL | Status: DC | PRN

## 2013-11-16 NOTE — Telephone Encounter (Signed)
Pt was given rx by [facility] [facility]le

## 2013-11-16 NOTE — Telephone Encounter (Signed)
Pt needs her alprazolam refilled as soon as possible pt is out

## 2013-11-16 NOTE — Telephone Encounter (Signed)
Rx printed at 104. Patient can come there to get it before 5 pm, or I will bring it to 102 after clinic.  Meds ordered this encounter  Medications  . ALPRAZolam (XANAX) 1 MG tablet    Sig: Take 1 tablet (1 mg total) by mouth 2 (two) times daily as needed.    Dispense:  30 tablet    Refill:  0    Order Specific Question:  Supervising Provider    Answer:  DOOLITTLE, ROBERT P [[id number]]

## 2013-11-21 ENCOUNTER — Other Ambulatory Visit (INDEPENDENT_AMBULATORY_CARE_PROVIDER_SITE_OTHER): Payer: Self-pay

## 2013-11-21 ENCOUNTER — Telehealth: Payer: Self-pay

## 2013-11-21 DIAGNOSIS — E039 Hypothyroidism, unspecified: Secondary | ICD-10-CM

## 2013-11-21 NOTE — Telephone Encounter (Signed)
Error

## 2013-11-22 LAB — TSH: TSH: 1.563 u[IU]/mL (ref 0.350–4.500)

## 2013-12-12 ENCOUNTER — Telehealth: Payer: Self-pay

## 2013-12-12 DIAGNOSIS — I10 Essential (primary) hypertension: Secondary | ICD-10-CM

## 2013-12-12 DIAGNOSIS — G4700 Insomnia, unspecified: Secondary | ICD-10-CM

## 2013-12-12 NOTE — Telephone Encounter (Signed)
Alexis French, pt needs a 90 d supply of "all" of her meds please.

## 2013-12-13 MED ORDER — ESCITALOPRAM OXALATE 20 MG PO TABS
20.0000 mg | ORAL_TABLET | Freq: Every day | ORAL | Status: DC
Start: 2013-12-13 — End: 2014-03-13

## 2013-12-13 MED ORDER — LEVOTHYROXINE SODIUM 200 MCG PO TABS: 200.0000 ug | ORAL_TABLET | Freq: Every day | ORAL | Status: DC

## 2013-12-13 MED ORDER — BUSPIRONE HCL 15 MG PO TABS: 15.0000 mg | ORAL_TABLET | Freq: Two times a day (BID) | ORAL | Status: DC

## 2013-12-13 MED ORDER — HYDROCHLOROTHIAZIDE 25 MG PO TABS: ORAL_TABLET | ORAL | Status: DC

## 2014-01-08 ENCOUNTER — Encounter: Payer: Self-pay | Admitting: *Deleted

## 2014-01-08 ENCOUNTER — Telehealth: Payer: Self-pay | Admitting: *Deleted

## 2014-01-08 MED ORDER — DOXYCYCLINE HYCLATE 100 MG PO TABS: 200.0000 mg | ORAL_TABLET | Freq: Once | ORAL | Status: DC

## 2014-01-08 NOTE — Progress Notes (Signed)
   Subjective:    Patient ID: Alexis French, female    DOB: 01/23/1976, 38 y.o.   MRN: [id number]  HPI  Pt was working here, seen in the lab with ticks on her back. She was advised to return home and shower to remove the ticks from her body. She stated that she must have gotten them while feeding her horses.   Review of Systems     Objective:   Physical Exam        Assessment & Plan:  Will give Doxycycline 200mg  one dose as prophylaxis.

## 2014-01-08 NOTE — Telephone Encounter (Signed)
See Documentation.

## 2014-02-05 ENCOUNTER — Encounter: Payer: Self-pay | Admitting: Physician Assistant

## 2014-02-05 ENCOUNTER — Other Ambulatory Visit: Payer: Self-pay | Admitting: Physician Assistant

## 2014-02-05 ENCOUNTER — Telehealth: Payer: Self-pay

## 2014-02-05 ENCOUNTER — Ambulatory Visit (INDEPENDENT_AMBULATORY_CARE_PROVIDER_SITE_OTHER): Payer: Self-pay | Admitting: Physician Assistant

## 2014-02-05 VITALS — BP 124/88 | HR 77 | Temp 98.5°F | Resp 16 | Ht 64.5 in | Wt 225.0 lb

## 2014-02-05 DIAGNOSIS — F988 Other specified behavioral and emotional disorders with onset usually occurring in childhood and adolescence: Secondary | ICD-10-CM

## 2014-02-05 DIAGNOSIS — E039 Hypothyroidism, unspecified: Secondary | ICD-10-CM

## 2014-02-05 LAB — TSH: TSH: 30.389 u[IU]/mL — AB (ref 0.350–4.500)

## 2014-02-05 LAB — T4, FREE: Free T4: 1.12 ng/dL (ref 0.80–1.80)

## 2014-02-05 MED ORDER — AMPHETAMINE-DEXTROAMPHET ER 15 MG PO CP24: 15.0000 mg | ORAL_CAPSULE | ORAL | Status: DC

## 2014-02-05 MED ORDER — SYNTHROID 125 MCG PO TABS: 250.0000 ug | ORAL_TABLET | Freq: Every day | ORAL | Status: DC

## 2014-02-05 NOTE — Telephone Encounter (Signed)
Pt wanted me to let you know that the pharm said that unless she was eating an excessive amt of leafy green vegetables (which she hasn't any more that usual), that food shouldn't affect her thyroid. Pharm also told her that she may need more testing done like adrenal gland tests, etc.

## 2014-02-08 LAB — T3 UPTAKE: T3 UPTAKE: 26.2 % (ref 22.5–37.0)

## 2014-02-08 LAB — T3, FREE: T3, Free: 2.4 pg/mL (ref 2.3–4.2)

## 2014-02-08 NOTE — Telephone Encounter (Signed)
Noted  

## 2014-02-08 NOTE — Progress Notes (Signed)
   Subjective:    Patient ID: Alexis French, female    DOB: 09/02/1975, 38 y.o.   MRN: [id number]  HPI Pt presents to clinic for recheck of her thyroid and possible restart of her ADD medications.  She recently has been having problems with increased fatigue and trouble with weight loss.  She would like to check her thyroid function because she feels like the levels are off.  She takes brand medication and has not changed when she takes the medication compared to the past.  She went to endo and was not really given any more information about why her TSH levels significantly changed but her T4 did not while on medications.  She would also like to discuss going back on Adderall.  She feels that she is better able to focus while on the medications and get tasks done.  Review of Systems  Constitutional: Positive for fatigue.  Psychiatric/Behavioral: Positive for decreased concentration.       Objective:   Physical Exam  Vitals reviewed. Constitutional: She is oriented to person, place, and time. She appears well-developed and well-nourished.  HENT:  Head: Normocephalic and atraumatic.  Right Ear: External ear normal.  Left Ear: External ear normal.  Neck: Normal range of motion. No mass and no thyromegaly present.  Pulmonary/Chest: Effort normal.  Neurological: She is alert and oriented to person, place, and time.  Skin: Skin is warm and dry.  Psychiatric: She has a normal mood and affect. Her behavior is normal. Judgment and thought content normal.   We reviewed her lab results while she was here and the TSH is really elevated but her Free T4 was normal.     Assessment & Plan:  Unspecified hypothyroidism - Plan: TSH, T4, Free, TSH, SYNTHROID 125 MCG tablet, T3, Free, T3 Uptake, Thyroid antibodies, - we will look further into her elevated TSH - it seems strange that her TSH is really elevated but her free T4 is normal on medications.  ADD (attention deficit disorder) - We will restart  the medications- she is planning on return to school in the fall and this will allow her to prepare.  Plan: amphetamine-dextroamphetamine (ADDERALL XR) 15 MG 24 hr capsule  Benny LennertSarah Weber PA-C  [facility] 02/08/2014 2:07 PM

## 2014-02-09 LAB — THYROID ANTIBODIES: THYROID PEROXIDASE ANTIBODY: 486 [IU]/mL — AB (ref <35.0)

## 2014-03-09 ENCOUNTER — Telehealth: Payer: Self-pay

## 2014-03-09 DIAGNOSIS — I10 Essential (primary) hypertension: Secondary | ICD-10-CM

## 2014-03-09 DIAGNOSIS — G4700 Insomnia, unspecified: Secondary | ICD-10-CM

## 2014-03-09 DIAGNOSIS — E039 Hypothyroidism, unspecified: Secondary | ICD-10-CM

## 2014-03-09 NOTE — Telephone Encounter (Signed)
Pt needs a RF of Buspar. She is taking 2 qam and one q afternoon. Needs a RF of Xanax, Synthroid, HCT and Lexapro as well. 90 day supplies on what you can please. Thanks

## 2014-03-13 MED ORDER — ALPRAZOLAM 1 MG PO TABS: 1.0000 mg | ORAL_TABLET | Freq: Two times a day (BID) | ORAL | Status: DC | PRN

## 2014-03-13 MED ORDER — SYNTHROID 125 MCG PO TABS: 250.0000 ug | ORAL_TABLET | Freq: Every day | ORAL | Status: DC

## 2014-03-13 MED ORDER — ESCITALOPRAM OXALATE 20 MG PO TABS: 20.0000 mg | ORAL_TABLET | Freq: Every day | ORAL | Status: DC

## 2014-03-13 MED ORDER — HYDROCHLOROTHIAZIDE 25 MG PO TABS: ORAL_TABLET | ORAL | Status: DC

## 2014-03-13 MED ORDER — BUSPIRONE HCL 15 MG PO TABS: ORAL_TABLET | ORAL | Status: DC

## 2014-03-13 NOTE — Telephone Encounter (Signed)
Done

## 2014-03-25 ENCOUNTER — Telehealth: Payer: Self-pay

## 2014-03-25 MED ORDER — ESCITALOPRAM OXALATE 20 MG PO TABS: 20.0000 mg | ORAL_TABLET | Freq: Every day | ORAL | Status: DC

## 2014-03-25 NOTE — Telephone Encounter (Signed)
Pt never picked up her rx that we sent in on 9/8. Wants to know if we can transfer it to [facility] as she is currently there and has taken her last tab. Per Chelle this is ok to do.

## 2014-04-13 IMAGING — CR DG LUMBAR SPINE COMPLETE 4+V
5 series · 5 of 5 positions shown · non-contrast
Comparison: None.

CLINICAL DATA: Low back pain.

EXAM:
LUMBAR SPINE - COMPLETE 4+ VIEW

[AP (1 of 2)]
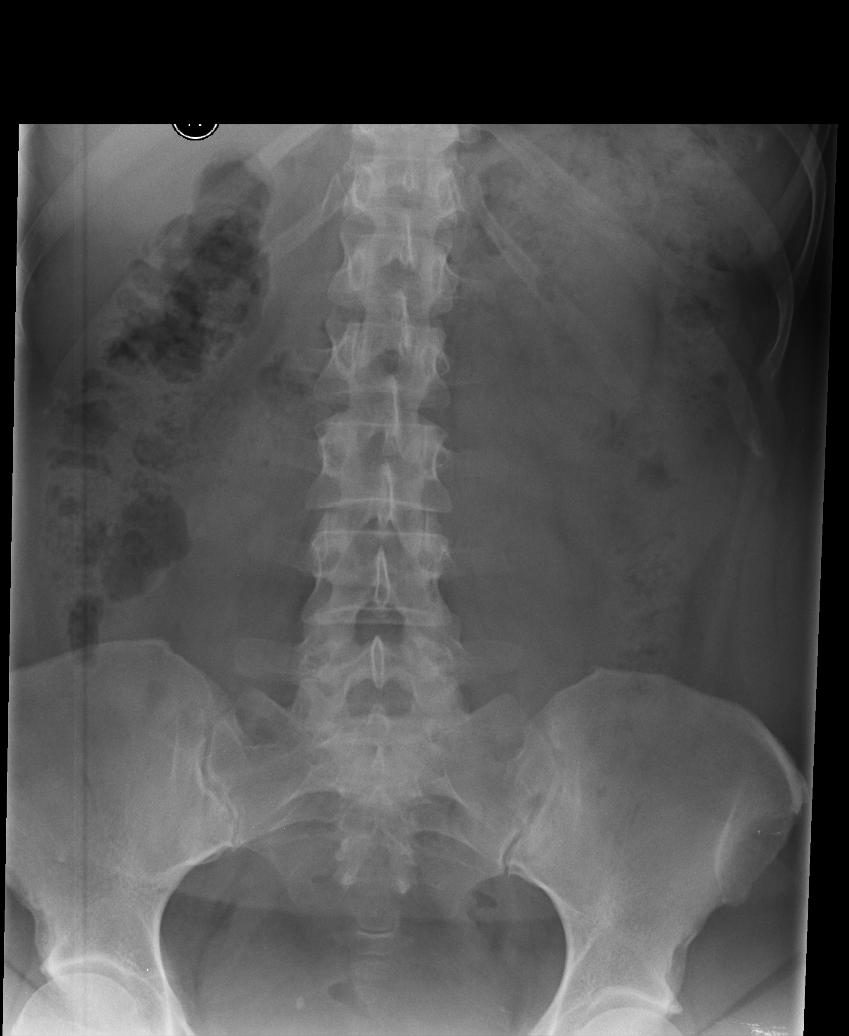

[rpo]
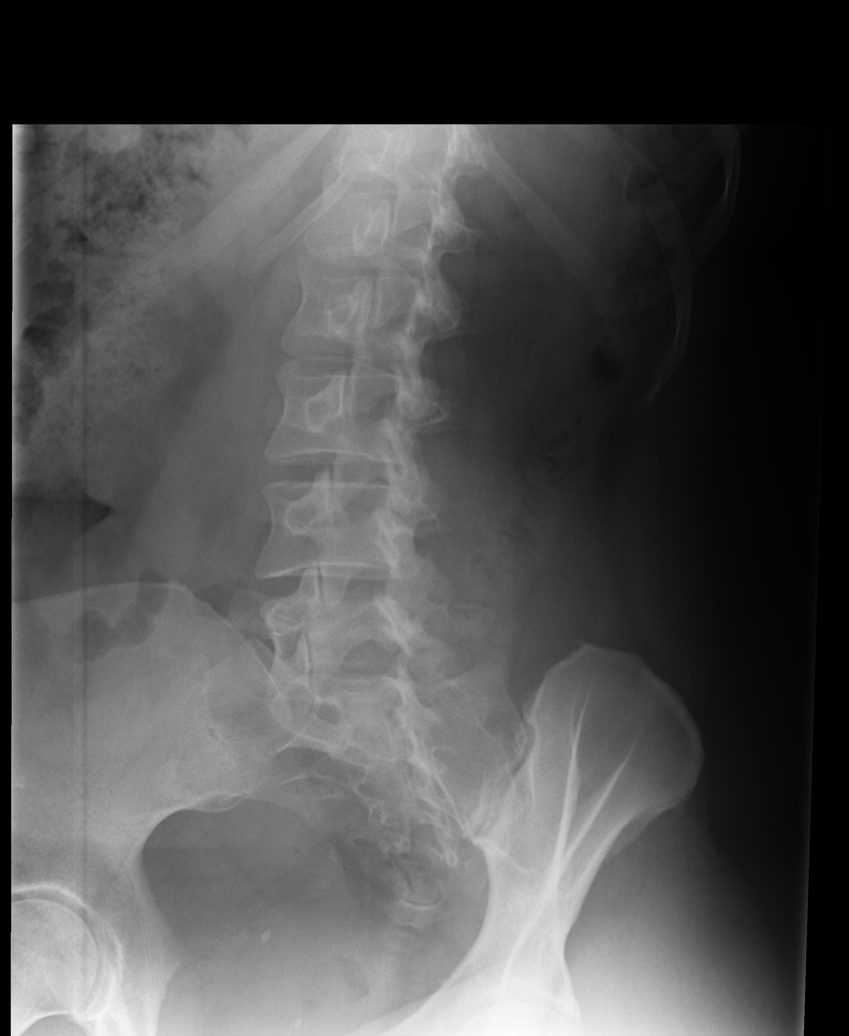

[lpo]
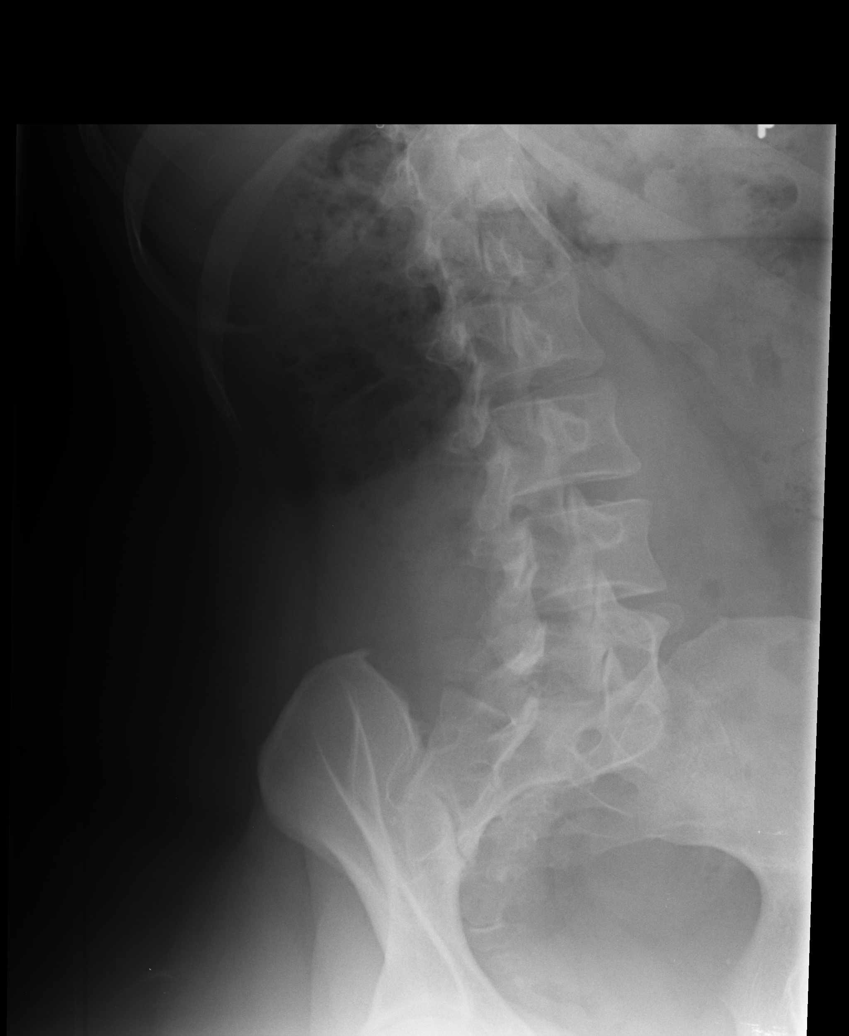

[AP (2 of 2)]
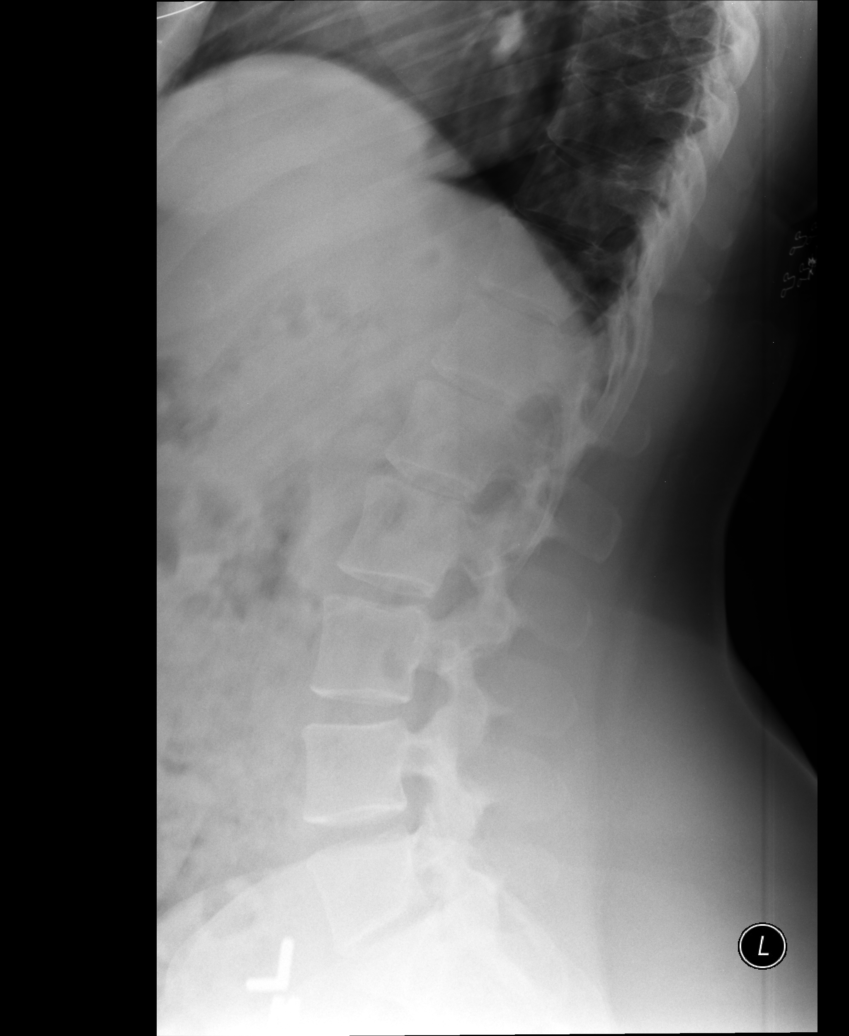

[l5 s1]
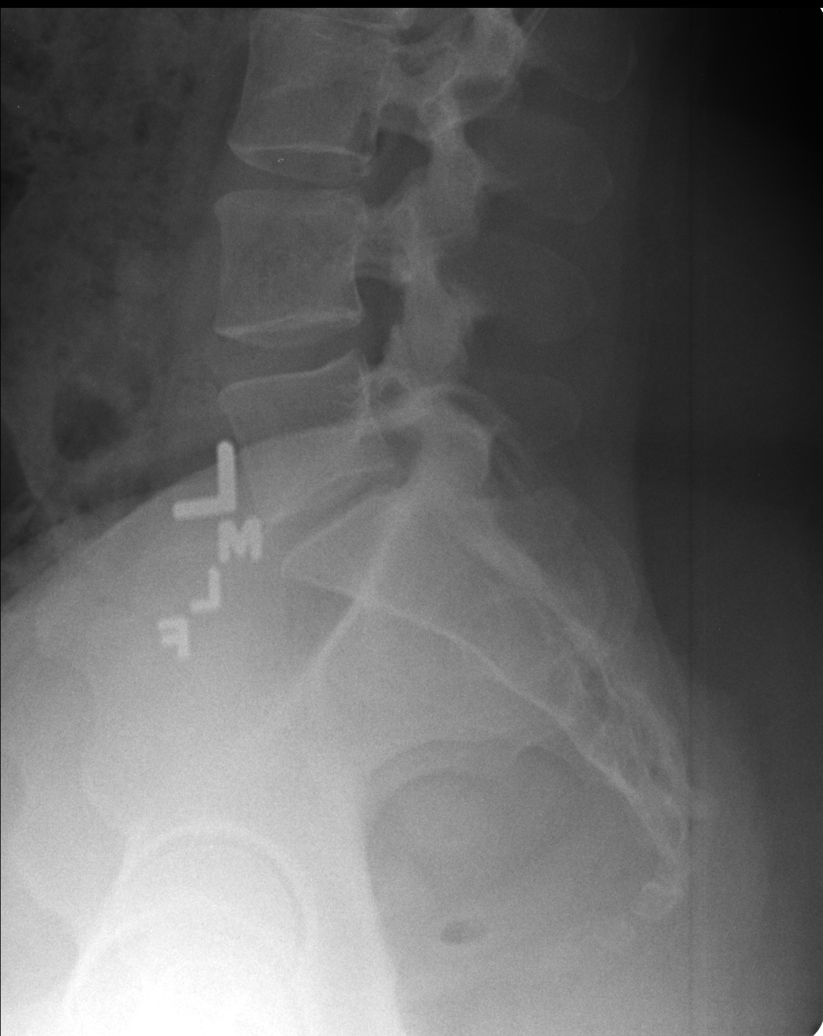

[5 of 5 positions shown; findings below may reference images not displayed]

FINDINGS: There is no evidence of lumbar spine fracture. Alignment is normal.
Intervertebral disc spaces are maintained. Facet joints are normal.
IMPRESSION: Normal exam.

## 2014-05-07 ENCOUNTER — Encounter: Payer: Self-pay | Admitting: Physician Assistant

## 2014-05-08 ENCOUNTER — Other Ambulatory Visit: Payer: Self-pay

## 2014-05-08 DIAGNOSIS — B009 Herpesviral infection, unspecified: Secondary | ICD-10-CM

## 2014-06-14 ENCOUNTER — Other Ambulatory Visit (INDEPENDENT_AMBULATORY_CARE_PROVIDER_SITE_OTHER): Payer: Self-pay

## 2014-06-14 DIAGNOSIS — E039 Hypothyroidism, unspecified: Secondary | ICD-10-CM

## 2014-06-14 DIAGNOSIS — E063 Autoimmune thyroiditis: Secondary | ICD-10-CM | POA: Insufficient documentation

## 2014-06-14 NOTE — Addendum Note (Signed)
Addended byLevon Hedger: CLINE, REBEKAH A on: 06/14/2014 05:54 PM   Modules accepted: Orders

## 2014-06-14 NOTE — Addendum Note (Signed)
Addended byLevon Hedger: Jamise Pentland A on: 06/14/2014 05:53 PM   Modules accepted: Orders

## 2014-06-15 LAB — TSH: TSH: 0.067 u[IU]/mL — AB (ref 0.350–4.500)

## 2014-06-16 LAB — THYROID PEROXIDASE ANTIBODY: Thyroperoxidase Ab SerPl-aCnc: 220 IU/mL — ABNORMAL HIGH (ref <9)

## 2014-06-21 ENCOUNTER — Other Ambulatory Visit: Payer: Self-pay | Admitting: Physician Assistant

## 2014-06-21 DIAGNOSIS — E038 Other specified hypothyroidism: Secondary | ICD-10-CM

## 2014-06-21 MED ORDER — LEVOTHYROXINE SODIUM 112 MCG PO TABS: 112.0000 ug | ORAL_TABLET | Freq: Every day | ORAL | Status: DC

## 2014-06-21 MED ORDER — LEVOTHYROXINE SODIUM 112 MCG PO TABS: 224.0000 ug | ORAL_TABLET | Freq: Every day | ORAL | Status: DC

## 2014-06-25 ENCOUNTER — Telehealth: Payer: Self-pay

## 2014-06-25 MED ORDER — ESCITALOPRAM OXALATE 20 MG PO TABS: 20.0000 mg | ORAL_TABLET | Freq: Every day | ORAL | Status: DC

## 2014-06-25 NOTE — Telephone Encounter (Signed)
[facility]. [place] Please advise.

## 2014-06-26 NOTE — Telephone Encounter (Signed)
Pt.notified

## 2014-07-17 ENCOUNTER — Encounter: Payer: Self-pay | Admitting: Physician Assistant

## 2014-07-17 ENCOUNTER — Ambulatory Visit (INDEPENDENT_AMBULATORY_CARE_PROVIDER_SITE_OTHER): Payer: Self-pay | Admitting: Internal Medicine

## 2014-07-17 ENCOUNTER — Ambulatory Visit (INDEPENDENT_AMBULATORY_CARE_PROVIDER_SITE_OTHER): Payer: Self-pay

## 2014-07-17 VITALS — BP 132/90 | HR 105 | Temp 98.1°F | Resp 18 | Ht 64.25 in | Wt 230.0 lb

## 2014-07-17 DIAGNOSIS — M79672 Pain in left foot: Secondary | ICD-10-CM

## 2014-07-17 NOTE — Progress Notes (Signed)
Subjective:    Patient ID: Alexis French, female    DOB: 04/14/1976, 39 y.o.   MRN: [id number]  HPI  This is a 39 year old female with PMH HTN, ADD, anxiety and depression who is presenting with left foot pain x 3 days. She dropped a trailer that hitches to the back of a car on the top of her foot. She is having lots of tenderness described as soreness. She is able to put weight on that foot. She is endorsing numbness of the top of the foot and especially in her 2nd toe. When she everts her ankle she gets increased tingling in that toe. She has noticed some swelling and bruising. Her movement is not limited. She denies weakness.  Review of Systems  Constitutional: Negative for fever and chills.  Gastrointestinal: Negative for nausea, vomiting and diarrhea.  Musculoskeletal: Positive for arthralgias. Negative for gait problem.  Skin: Positive for wound.  Neurological: Positive for numbness. Negative for weakness.    Patient Active Problem List   Diagnosis Date Noted  . Hashimoto's disease 06/14/2014  . Obesity, Class I, BMI 30-34.9 05/01/2013  . Immune to varicella 12/15/2012  . Immune to measles 12/15/2012  . Immune to mumps 12/15/2012  . Immune to rubella 12/15/2012  . Hypothyroidism 01/27/2012  . ADD (attention deficit disorder) 12/08/2011  . HTN (hypertension) 12/08/2011  . Anxiety and depression 12/08/2011   Allergies  Allergen Reactions  . Penicillins Other (See Comments)    Blisters on the soles and her feet   Patient's social and family history were reviewed.     Objective:   Physical Exam  Constitutional: She is oriented to person, place, and time. She appears well-developed and well-nourished. No distress.  HENT:  Head: Normocephalic and atraumatic.  Right Ear: Hearing normal.  Left Ear: Hearing normal.  Nose: Nose normal.  Eyes: Conjunctivae and lids are normal. Right eye exhibits no discharge. Left eye exhibits no discharge. No scleral icterus.    Cardiovascular: Normal rate, regular rhythm, intact distal pulses and normal pulses.   Pulmonary/Chest: Effort normal. No respiratory distress.  Musculoskeletal: Normal range of motion.       Left ankle: Normal. No tenderness. No lateral malleolus and no medial malleolus tenderness found.       Left foot: There is tenderness (top of dorsal foot, see depiction). There is normal range of motion and normal capillary refill.       Feet:  Neurological: She is alert and oriented to person, place, and time. She has normal strength and normal reflexes. A sensory deficit (decreased sensation over left dorsal foot) is present.  Skin: Skin is warm and dry.  Healing 3 cm laceration over left dorsal foot. Some slight surrounding erythema. Ecchymosis of dorsal foot.  Psychiatric: She has a normal mood and affect. Her speech is normal and behavior is normal. Thought content normal.   [facility] reading (PRIMARY) by  Dr. Perrin MalteseGuest: questionable fracture through middle cuneiform bone.     Assessment & Plan:  1. Foot pain, left Likely pt has bruised the soft tissue and nerve on dorsal left foot. Radiograph negative for fracture. Advised she wear a post-op boot or at least a supportive shoe at all times. Pt does not want to wear a boot. She should try to stay off feet as much as possible for next week. Counseled on RICE. She will return to be seen if symptoms do not improve in 10-14 days. - DG Foot Complete Left; Future  [place] Brackett, MHS [facility]  07/17/2014

## 2014-07-18 ENCOUNTER — Other Ambulatory Visit: Payer: Self-pay | Admitting: Physician Assistant

## 2014-07-24 ENCOUNTER — Other Ambulatory Visit: Payer: Self-pay

## 2014-07-24 DIAGNOSIS — B009 Herpesviral infection, unspecified: Secondary | ICD-10-CM

## 2014-07-24 NOTE — Telephone Encounter (Signed)
Pt would like a RF of Valtrex please. Pended

## 2014-07-26 MED ORDER — VALACYCLOVIR HCL 1 G PO TABS: 1000.0000 mg | ORAL_TABLET | Freq: Two times a day (BID) | ORAL | Status: DC

## 2014-09-29 ENCOUNTER — Telehealth: Payer: Self-pay

## 2014-09-29 DIAGNOSIS — I10 Essential (primary) hypertension: Secondary | ICD-10-CM

## 2014-09-29 DIAGNOSIS — G4700 Insomnia, unspecified: Secondary | ICD-10-CM

## 2014-09-29 NOTE — Telephone Encounter (Signed)
Xanax* and HCT

## 2014-09-29 NOTE — Telephone Encounter (Signed)
Pt needs a RF of Arrow ElectronicsXanac

## 2014-09-30 MED ORDER — ALPRAZOLAM 1 MG PO TABS: 1.0000 mg | ORAL_TABLET | Freq: Two times a day (BID) | ORAL | Status: DC | PRN

## 2014-09-30 MED ORDER — HYDROCHLOROTHIAZIDE 25 MG PO TABS: ORAL_TABLET | ORAL | Status: DC

## 2014-09-30 NOTE — Telephone Encounter (Signed)
Ready - pt will need an OV.

## 2014-10-17 ENCOUNTER — Ambulatory Visit: Payer: 59

## 2014-10-19 ENCOUNTER — Other Ambulatory Visit: Payer: Self-pay | Admitting: Physician Assistant

## 2014-10-19 ENCOUNTER — Other Ambulatory Visit (INDEPENDENT_AMBULATORY_CARE_PROVIDER_SITE_OTHER): Payer: 59 | Admitting: *Deleted

## 2014-10-19 DIAGNOSIS — Z13228 Encounter for screening for other metabolic disorders: Secondary | ICD-10-CM

## 2014-10-19 DIAGNOSIS — Z130 Encounter for screening for diseases of the blood and blood-forming organs and certain disorders involving the immune mechanism: Secondary | ICD-10-CM

## 2014-10-19 DIAGNOSIS — E039 Hypothyroidism, unspecified: Secondary | ICD-10-CM

## 2014-10-19 DIAGNOSIS — Z13220 Encounter for screening for lipoid disorders: Secondary | ICD-10-CM

## 2014-10-19 LAB — CBC WITH DIFFERENTIAL/PLATELET
BASOS PCT: 0 % (ref 0–1)
Basophils Absolute: 0 10*3/uL (ref 0.0–0.1)
Eosinophils Absolute: 0.3 10*3/uL (ref 0.0–0.7)
Eosinophils Relative: 4 % (ref 0–5)
HCT: 43.7 % (ref 36.0–46.0)
Hemoglobin: 14.9 g/dL (ref 12.0–15.0)
Lymphocytes Relative: 41 % (ref 12–46)
Lymphs Abs: 2.9 10*3/uL (ref 0.7–4.0)
MCH: 31.6 pg (ref 26.0–34.0)
MCHC: 34.1 g/dL (ref 30.0–36.0)
MCV: 92.8 fL (ref 78.0–100.0)
MPV: 9.6 fL (ref 8.6–12.4)
Monocytes Absolute: 0.5 10*3/uL (ref 0.1–1.0)
Monocytes Relative: 7 % (ref 3–12)
NEUTROS PCT: 48 % (ref 43–77)
Neutro Abs: 3.4 10*3/uL (ref 1.7–7.7)
Platelets: 283 10*3/uL (ref 150–400)
RBC: 4.71 MIL/uL (ref 3.87–5.11)
RDW: 13.2 % (ref 11.5–15.5)
WBC: 7 10*3/uL (ref 4.0–10.5)

## 2014-10-19 NOTE — Progress Notes (Signed)
Pt here for lab draw only  

## 2014-10-19 NOTE — Progress Notes (Signed)
Having a CPE tomorrow would like to have labs drawn.

## 2014-10-20 ENCOUNTER — Ambulatory Visit (INDEPENDENT_AMBULATORY_CARE_PROVIDER_SITE_OTHER): Payer: 59 | Admitting: Physician Assistant

## 2014-10-20 VITALS — BP 140/90 | HR 88 | Temp 98.0°F | Resp 16 | Ht 64.5 in | Wt 236.0 lb

## 2014-10-20 DIAGNOSIS — E063 Autoimmune thyroiditis: Secondary | ICD-10-CM

## 2014-10-20 DIAGNOSIS — F418 Other specified anxiety disorders: Secondary | ICD-10-CM

## 2014-10-20 DIAGNOSIS — F988 Other specified behavioral and emotional disorders with onset usually occurring in childhood and adolescence: Secondary | ICD-10-CM

## 2014-10-20 DIAGNOSIS — E669 Obesity, unspecified: Secondary | ICD-10-CM

## 2014-10-20 DIAGNOSIS — J302 Other seasonal allergic rhinitis: Secondary | ICD-10-CM | POA: Diagnosis not present

## 2014-10-20 DIAGNOSIS — Z0000 Encounter for general adult medical examination without abnormal findings: Secondary | ICD-10-CM

## 2014-10-20 DIAGNOSIS — B009 Herpesviral infection, unspecified: Secondary | ICD-10-CM | POA: Diagnosis not present

## 2014-10-20 DIAGNOSIS — M79671 Pain in right foot: Secondary | ICD-10-CM | POA: Diagnosis not present

## 2014-10-20 DIAGNOSIS — I10 Essential (primary) hypertension: Secondary | ICD-10-CM | POA: Diagnosis not present

## 2014-10-20 DIAGNOSIS — F419 Anxiety disorder, unspecified: Secondary | ICD-10-CM

## 2014-10-20 DIAGNOSIS — M79672 Pain in left foot: Secondary | ICD-10-CM

## 2014-10-20 DIAGNOSIS — F329 Major depressive disorder, single episode, unspecified: Secondary | ICD-10-CM

## 2014-10-20 DIAGNOSIS — F909 Attention-deficit hyperactivity disorder, unspecified type: Secondary | ICD-10-CM | POA: Diagnosis not present

## 2014-10-20 DIAGNOSIS — F32A Depression, unspecified: Secondary | ICD-10-CM

## 2014-10-20 DIAGNOSIS — G4700 Insomnia, unspecified: Secondary | ICD-10-CM

## 2014-10-20 DIAGNOSIS — N926 Irregular menstruation, unspecified: Secondary | ICD-10-CM

## 2014-10-20 LAB — LIPID PANEL
CHOL/HDL RATIO: 2.3 ratio
CHOLESTEROL: 171 mg/dL (ref 0–200)
HDL: 75 mg/dL (ref >=46)
LDL Cholesterol: 74 mg/dL (ref 0–99)
Triglycerides: 108 mg/dL (ref <150)
VLDL: 22 mg/dL (ref 0–40)

## 2014-10-20 LAB — COMPLETE METABOLIC PANEL WITH GFR
ALT: 19 U/L (ref 0–35)
AST: 18 U/L (ref 0–37)
Albumin: 4.1 g/dL (ref 3.5–5.2)
Alkaline Phosphatase: 120 U/L — ABNORMAL HIGH (ref 39–117)
BUN: 12 mg/dL (ref 6–23)
CALCIUM: 9.5 mg/dL (ref 8.4–10.5)
CHLORIDE: 100 meq/L (ref 96–112)
CO2: 28 meq/L (ref 19–32)
CREATININE: 0.61 mg/dL (ref 0.50–1.10)
GLUCOSE: 78 mg/dL (ref 70–99)
Potassium: 4.4 mEq/L (ref 3.5–5.3)
Sodium: 137 mEq/L (ref 135–145)
Total Bilirubin: 0.4 mg/dL (ref 0.2–1.2)
Total Protein: 7.2 g/dL (ref 6.0–8.3)

## 2014-10-20 LAB — TSH: TSH: 0.069 u[IU]/mL — ABNORMAL LOW (ref 0.350–4.500)

## 2014-10-20 LAB — T4, FREE: Free T4: 1.51 ng/dL (ref 0.80–1.80)

## 2014-10-20 MED ORDER — TRAZODONE HCL 50 MG PO TABS: 25.0000 mg | ORAL_TABLET | Freq: Every evening | ORAL | Status: DC | PRN

## 2014-10-20 MED ORDER — DESOGESTREL-ETHINYL ESTRADIOL 0.15-30 MG-MCG PO TABS: 1.0000 | ORAL_TABLET | Freq: Every day | ORAL | Status: DC

## 2014-10-20 MED ORDER — ALPRAZOLAM 1 MG PO TABS: 1.0000 mg | ORAL_TABLET | Freq: Two times a day (BID) | ORAL | Status: DC | PRN

## 2014-10-20 MED ORDER — DICLOFENAC SODIUM 1 % TD GEL: 2.0000 g | Freq: Four times a day (QID) | TRANSDERMAL | Status: DC

## 2014-10-20 MED ORDER — VALACYCLOVIR HCL 1 G PO TABS: ORAL_TABLET | ORAL | Status: DC

## 2014-10-20 MED ORDER — SYNTHROID 175 MCG PO TABS: 175.0000 ug | ORAL_TABLET | Freq: Every day | ORAL | Status: DC

## 2014-10-20 MED ORDER — FEXOFENADINE HCL 180 MG PO TABS: 180.0000 mg | ORAL_TABLET | Freq: Every day | ORAL | Status: DC

## 2014-10-20 MED ORDER — HYDROCHLOROTHIAZIDE 25 MG PO TABS: 25.0000 mg | ORAL_TABLET | Freq: Every day | ORAL | Status: DC

## 2014-10-20 MED ORDER — ESCITALOPRAM OXALATE 20 MG PO TABS: 20.0000 mg | ORAL_TABLET | Freq: Every day | ORAL | Status: DC

## 2014-10-20 MED ORDER — BUSPIRONE HCL 15 MG PO TABS: ORAL_TABLET | ORAL | Status: DC

## 2014-10-20 MED ORDER — AMPHETAMINE-DEXTROAMPHET ER 15 MG PO CP24: 15.0000 mg | ORAL_CAPSULE | ORAL | Status: DC

## 2014-10-20 MED ORDER — MOMETASONE FUROATE 50 MCG/ACT NA SUSP: 2.0000 | Freq: Every day | NASAL | Status: DC

## 2014-10-20 NOTE — Progress Notes (Signed)
Subjective:    Patient ID: Alexis French, female    DOB: [birth year], 39 y.o.   MRN: [id number]  HPI  Here for a annual exam.  She is doing well.  She does not feel good.  Sweating all the time, more anxious but she is stressed.  She is trying to lose weight but she is not being successful.  Her anxiety is a little worse than it has been but she thinks that she can understand why that is with her current life stress.  She does feels like she is doing ok.  She sold her horses that makes her sad and they were a stress reliever for her. Both her boys are at her moms in [place] - that makes her sad. She has money stress and work stress.  She is leaving her current job in 2 weeks and has a new job set up and she hopes that she has less stress at that job.  She is not sleeping well at [place].  Last Pap smear - 2014 normal with HPV high risk Last mammogram - 2013 Last Vision exam - next week she as an appt Last Dental exam - about 2 years ago  Patient Active Problem List   Diagnosis Date Noted  . Obesity, Class I, BMI 30-34.9 05/01/2013    Priority: Low  . Hashimoto's disease 06/14/2014  . Immune to varicella 12/15/2012  . Immune to measles 12/15/2012  . Immune to mumps 12/15/2012  . Immune to rubella 12/15/2012  . Hypothyroidism 01/27/2012  . ADD (attention deficit disorder) 12/08/2011  . HTN (hypertension) 12/08/2011  . Anxiety and depression 12/08/2011   Prior to Admission medications   Medication Sig Start Date End Date Taking? Authorizing Provider  ALPRAZolam Duanne Moron) 1 MG tablet Take 1 tablet (1 mg total) by mouth 2 (two) times daily as needed. 09/30/14  Yes Mancel Bale, PA-C  b complex vitamins tablet Take 1 tablet by mouth daily.   Yes Historical Provider, MD  BIOTIN 5000 PO Take 2 capsules by mouth daily.   Yes Historical Provider, MD  busPIRone (BUSPAR) 15 MG tablet TAKE 2 TABLETS BY MOUTH IN THE MORNING AND 1 TABLET EVERY AFTERNOON 07/18/14  Yes Chelle S Jeffery, PA-C  escitalopram  (LEXAPRO) 20 MG tablet Take 1 tablet (20 mg total) by mouth daily. 06/25/14  Yes Chelle S Jeffery, PA-C  hydrochlorothiazide (HYDRODIURIL) 25 MG tablet TAKE 1 TABLET BY MOUTH ONCE DAILY 09/30/14  Yes Mancel Bale, PA-C  levothyroxine (SYNTHROID, LEVOTHROID) 112 MCG tablet Take 2 tablets (224 mcg total) by mouth daily. 06/21/14  Yes Mancel Bale, PA-C  Multiple Vitamin (MULTIVITAMIN) tablet Take 1 tablet by mouth daily.   Yes Historical Provider, MD  amphetamine-dextroamphetamine (ADDERALL XR) 15 MG 24 hr capsule Take 1 capsule (15 mg total) by mouth every morning. Patient not taking: Reported on 07/17/2014 02/05/14   Mancel Bale, PA-C  valACYclovir (VALTREX) 1000 MG tablet Take 1 tablet (1,000 mg total) by mouth 2 (two) times daily. Patient not taking: Reported on 10/20/2014 07/26/14   Mancel Bale, PA-C   Allergies  Allergen Reactions  . Penicillins Other (See Comments)    Blisters on the soles and her feet   Medications, allergies, past medical history, surgical history, family history, social history and problem list reviewed and updated.  Review of Systems  Constitutional: Negative.   Respiratory: Negative for chest tightness and shortness of breath.   Cardiovascular: Positive for leg swelling (helps to wear compression  socks - stands all day at work).  Gastrointestinal: Negative.   Endocrine: Negative.   Genitourinary: Positive for menstrual problem (irregular and lighter than normal).  Musculoskeletal: Negative.   Skin: Negative.   Allergic/Immunologic: Negative.   Neurological: Positive for headaches.  Hematological: Negative.   Psychiatric/Behavioral: Positive for sleep disturbance, dysphoric mood and decreased concentration. The patient is nervous/anxious.       Objective:   Physical Exam  Constitutional: She is oriented to person, place, and time. She appears well-developed and well-nourished.  BP 140/90 mmHg  Pulse 88  Temp(Src) 98 F (36.7 C) (Oral)  Resp 16  Ht 5'  4.5" (1.638 m)  Wt 236 lb (107.049 kg)  BMI 39.90 kg/m2  SpO2 99%  LMP 10/10/2014 (Exact Date)   HENT:  Head: Normocephalic and atraumatic.  Right Ear: Hearing, tympanic membrane, external ear and ear canal normal.  Left Ear: Hearing, tympanic membrane, external ear and ear canal normal.  Nose: Nose normal.  Mouth/Throat: Uvula is midline, oropharynx is clear and moist and mucous membranes are normal.  Eyes: Conjunctivae and EOM are normal. Pupils are equal, round, and reactive to light.  Neck: Normal range of motion. Neck supple. No thyroid mass and no thyromegaly present.  Cardiovascular: Normal rate, regular rhythm and normal heart sounds.   No murmur heard. Pulmonary/Chest: Effort normal and breath sounds normal.  slight edema lower extremities.  Abdominal: Soft. Bowel sounds are normal.  Musculoskeletal: Normal range of motion.  Neurological: She is alert and oriented to person, place, and time. She has normal strength and normal reflexes. She displays normal reflexes. No sensory deficit.  Skin: Skin is warm and dry.  Psychiatric: She has a normal mood and affect. Her behavior is normal. Judgment and thought content normal.   Results for orders placed or performed in visit on 10/19/14  CBC with Differential/Platelet  Result Value Ref Range   WBC 7.0 4.0 - 10.5 K/uL   RBC 4.71 3.87 - 5.11 MIL/uL   Hemoglobin 14.9 12.0 - 15.0 g/dL   HCT 43.7 36.0 - 46.0 %   MCV 92.8 78.0 - 100.0 fL   MCH 31.6 26.0 - 34.0 pg   MCHC 34.1 30.0 - 36.0 g/dL   RDW 13.2 11.5 - 15.5 %   Platelets 283 150 - 400 K/uL   MPV 9.6 8.6 - 12.4 fL   Neutrophils Relative % 48 43 - 77 %   Neutro Abs 3.4 1.7 - 7.7 K/uL   Lymphocytes Relative 41 12 - 46 %   Lymphs Abs 2.9 0.7 - 4.0 K/uL   Monocytes Relative 7 3 - 12 %   Monocytes Absolute 0.5 0.1 - 1.0 K/uL   Eosinophils Relative 4 0 - 5 %   Eosinophils Absolute 0.3 0.0 - 0.7 K/uL   Basophils Relative 0 0 - 1 %   Basophils Absolute 0.0 0.0 - 0.1 K/uL    Smear Review Criteria for review not met   COMPLETE METABOLIC PANEL WITH GFR  Result Value Ref Range   Sodium 137 135 - 145 mEq/L   Potassium 4.4 3.5 - 5.3 mEq/L   Chloride 100 96 - 112 mEq/L   CO2 28 19 - 32 mEq/L   Glucose, Bld 78 70 - 99 mg/dL   BUN 12 6 - 23 mg/dL   Creat 0.61 0.50 - 1.10 mg/dL   Total Bilirubin 0.4 0.2 - 1.2 mg/dL   Alkaline Phosphatase 120 (H) 39 - 117 U/L   AST 18 0 - 37 U/L  ALT 19 0 - 35 U/L   Total Protein 7.2 6.0 - 8.3 g/dL   Albumin 4.1 3.5 - 5.2 g/dL   Calcium 9.5 8.4 - 10.5 mg/dL   GFR, Est African American >89 mL/min   GFR, Est Non African American >89 mL/min  Lipid panel  Result Value Ref Range   Cholesterol 171 0 - 200 mg/dL   Triglycerides 108 <150 mg/dL   HDL 75 >=46 mg/dL   Total CHOL/HDL Ratio 2.3 Ratio   VLDL 22 0 - 40 mg/dL   LDL Cholesterol 74 0 - 99 mg/dL  TSH  Result Value Ref Range   TSH 0.069 (L) 0.350 - 4.500 uIU/mL  T4, Free  Result Value Ref Range   Free T4 1.51 0.80 - 1.80 ng/dL       Assessment & Plan:  Insomnia - we are going to try trazodone to see if that will help her sleep.  It has made her groggy in the past but she would like to try again.  Plan: ALPRAZolam (XANAX) 1 MG tablet, traZODone (DESYREL) 50 MG tablet  ADD (attention deficit disorder) - Plan:  Now that she has insurance she would like to restart her medication. She definitely notices a difference in her work attention but she might also notice an increase in her anxiety without her focus. amphetamine-dextroamphetamine (ADDERALL XR) 15 MG 24 hr capsule  Essential hypertension - Controlled but she is having increased swelling - this is most likely related to her weight and uncontrolled thyroid that is currently overmedicated.  Plan: hydrochlorothiazide (HYDRODIURIL) 25 MG tablet  HSV infection - continue daily medication for suppression - Plan: valACYclovir (VALTREX) 1000 MG tablet  Hashimoto's disease - TSH is really low and she is over medicated at this  time - we are going to decrease her synthroid by 7m to see if she feels better - she will recheck in 5 weeks.  Plan: SYNTHROID 175 MCG tablet  Anxiety and depression - not completely controlled but we will allow her to start new job and see how her stress level changes and then re-evaluate at that time - Plan: busPIRone (BUSPAR) 15 MG tablet, escitalopram (LEXAPRO) 20 MG tablet  Other seasonal allergic rhinitis - Plan: fexofenadine (ALLEGRA) 180 MG tablet, mometasone (NASONEX) 50 MCG/ACT nasal spray  Irregular menses - restart ocp - expect her irregular menses to be related to her thyroid problems.  Plan: desogestrel-ethinyl estradiol (APRI) 0.15-30 MG-MCG tablet  Foot pain, bilateral - she continues to have problems with plantar fascitis - she will try voltaren gel and ice water bottle massage - if that does not work we will do a referral to ortho - losing weight will also help - Plan: diclofenac sodium (VOLTAREN) 1 % GEL  Obesity, Class I, BMI 30-34.9 - continue to try diet changes and exercise to help with weight loss - I suspect that she will improve once we get her thyroid controlled.  Recheck in 4-5 weeks.  SWindell HummingbirdPA-C  [facility] 10/20/2014 5:16 PM

## 2014-10-23 ENCOUNTER — Telehealth: Payer: Self-pay | Admitting: Radiology

## 2014-10-23 NOTE — Telephone Encounter (Signed)
Pt is requesting a refill of her nasonex.

## 2014-11-03 ENCOUNTER — Ambulatory Visit (INDEPENDENT_AMBULATORY_CARE_PROVIDER_SITE_OTHER): Payer: 59 | Admitting: Family Medicine

## 2014-11-03 VITALS — BP 112/80 | HR 94 | Temp 98.0°F | Resp 20 | Ht 64.5 in | Wt 236.0 lb

## 2014-11-03 DIAGNOSIS — S8991XA Unspecified injury of right lower leg, initial encounter: Secondary | ICD-10-CM

## 2014-11-03 MED ORDER — NABUMETONE 500 MG PO TABS: 500.0000 mg | ORAL_TABLET | Freq: Two times a day (BID) | ORAL | Status: DC | PRN

## 2014-11-03 MED ORDER — HYDROCODONE-ACETAMINOPHEN 5-325 MG PO TABS: 1.0000 | ORAL_TABLET | Freq: Four times a day (QID) | ORAL | Status: DC | PRN

## 2014-11-03 NOTE — Patient Instructions (Addendum)
You have a large amount of effusion in your knee today from the acute injury. Since the pain, popping, giving out is so similar to your left knee it is most likely that you have a meniscal injury as you did in your left knee.  However, we can't r/o a lateral collateral ligament sprain as well.   Ice and elevate x 3d - DO NOT WALK ON IT - NON-WEIGHTBEARING - the more you can do this, the sooner the swelling is going to go down, and the greater chance you have of the mensicus healing itself.  After 3-4 days you can begin weightbearing lightly and increase as tolerated but continue ice.  Wear the hinged brace for added stability and support until you are seen by an orthopedist (or here) in follow-up in 1 months.  If you are still having any pain or dysfunction at that time, we will order an MRI and see if you need surgical repair.  Knee, Cartilage (Meniscus) Injury It is suspected that you have a torn cartilage (meniscus) in your knee. The menisci are made of tough cartilage and fit between the surfaces of the thigh and leg bones. The menisci are C-shaped and have a wedged profile. The wedged profile helps the stability of the joint by keeping the rounded femur surface from sliding off the flat tibial surface. The menisci are fed (nourished) by small blood vessels, but there is also a large area at the inner edge of the meniscus that does not have a good blood supply (avascular). This presents a problem when there is an injury to the meniscus because areas without good blood supply heal poorly. As a result when there is a torn cartilage in the knee, surgery is often required to fix it. This is usually done with a surgical procedure less invasive than open surgery (arthroscopy). Some times open surgery of the knee is required if there is other damage. PURPOSE OF THE MENISCUS The medial meniscus rests on the medial tibial plateau. The tibia is the large bone in your lower leg (the shin bone). The medial tibial  plateau is the upper end of the bone making up the inner part of your knee. The lateral meniscus serves the same purpose and is located on the outside of the knee. The menisci help to distribute your body weight across the knee joint; they act as shock absorbers. Without the meniscus present, the weight of your body would be unevenly applied to the bones in your legs (the femur and tibia). The femur is the large bone in your thigh. This uneven weight distribution would cause increased wear and tear on the cartilage lining the joint surfaces, leading to early damage (arthritis) of these areas. The presence of the menisci cartilage is necessary for a healthy knee. PURPOSE OF THE KNEE CARTILAGE The knee joint is made up of three bones: the thigh bone (femur), the shin bone (tibia), and the knee cap (patella). The surfaces of these bones at the knee joint are covered with cartilage called articular cartilage. This smooth, slippery surface allows the bones to slide against each other without causing bone damage. The meniscus sits between these cartilaginous surfaces of the bones. It distributes the weight evenly in the joints and helps with the stability of the joint (keeps the joint steady). HOME CARE INSTRUCTIONS  Use crutches and external braces as instructed.  Once home, an ice pack applied to your injured knee may help with discomfort and keep the swelling down. An ice  pack can be used for the first couple of days or as instructed.  Only take over-the-counter or prescription medicines for pain, discomfort, or fever as directed by your caregiver.  Call if you do not have relief of pain with medications or if there is increasing in pain.  Call if your foot becomes cold or blue.  You may resume normal diet and activities as directed.  Make sure to keep your appointments with your follow-up caregiver. This injury may require further evaluation and treatment beyond the temporary treatment given  today. Document Released: 09/12/2002 Document Revised: 11/06/2013 Document Reviewed: 01/04/2009 [facility] Patient Information 2015 [place], [place]. This information is not intended to replace advice given to you by your health care provider. Make sure you discuss any questions you have with your health care provider.

## 2014-11-05 NOTE — Progress Notes (Signed)
Subjective:    Patient ID: Alexis French, female    DOB: 12/23/1975, 39 y.o.   MRN: [id number] Chief Complaint  Patient presents with  . Knee Injury    Twisted Right Knee playing football last night  . Rash    on right leg    HPI  Last night she was playing football bur pivoted on  Knee with planted foot.   Heard pop immediately and she couldn't walk on her knee until this morning.   Has been using RCE.  Feels identical to the pian and symptoms that she had when she tore the left meniscal injury. rec conservative treatment for now.  Has had very suble light tan hyperpigmentation  Past Medical History  Diagnosis Date  . Hypertension   . STD (sexually transmitted disease)     HSV2  . Attention deficit disorder (ADD)   . Anxiety   . Thyroid disease     Hypothyroid  . ASCUS (atypical squamous cells of undetermined significance) on Pap smear   . Allergy   . Cervical high risk human papillomavirus (HPV) DNA test positive 09/2012    Positive high risk HPV screen, negative subtypes 16/18   Current Outpatient Prescriptions on File Prior to Visit  Medication Sig Dispense Refill  . ALPRAZolam (XANAX) 1 MG tablet Take 1 tablet (1 mg total) by mouth 2 (two) times daily as needed. 30 tablet 0  . amphetamine-dextroamphetamine (ADDERALL XR) 15 MG 24 hr capsule Take 1 capsule by mouth every morning. 30 capsule 0  . b complex vitamins tablet Take 1 tablet by mouth daily.    Marland Kitchen. BIOTIN 5000 PO Take 2 capsules by mouth daily.    . busPIRone (BUSPAR) 15 MG tablet TAKE 2 TABLETS BY MOUTH IN THE MORNING AND 1 TABLET EVERY AFTERNOON 270 tablet 0  . desogestrel-ethinyl estradiol (APRI) 0.15-30 MG-MCG tablet Take 1 tablet by mouth daily. 3 Package 3  . diclofenac sodium (VOLTAREN) 1 % GEL Apply 2 g topically 4 (four) times daily. 100 g 0  . escitalopram (LEXAPRO) 20 MG tablet Take 1 tablet (20 mg total) by mouth daily. 90 tablet 0  . fexofenadine (ALLEGRA) 180 MG tablet Take 1 tablet (180 mg total) by  mouth daily. 90 tablet 3  . hydrochlorothiazide (HYDRODIURIL) 25 MG tablet Take 1-2 tablets (25-50 mg total) by mouth daily. TAKE 1 TABLET BY MOUTH ONCE DAILY 180 tablet 0  . levothyroxine (SYNTHROID, LEVOTHROID) 112 MCG tablet Take 2 tablets (224 mcg total) by mouth daily. 180 tablet 0  . mometasone (NASONEX) 50 MCG/ACT nasal spray Place 2 sprays into the nose daily. 17 g 12  . Multiple Vitamin (MULTIVITAMIN) tablet Take 1 tablet by mouth daily.    Marland Kitchen. SYNTHROID 175 MCG tablet Take 1 tablet (175 mcg total) by mouth daily before breakfast. 90 tablet 0  . traZODone (DESYREL) 50 MG tablet Take 0.5-2 tablets (25-100 mg total) by mouth at bedtime as needed for sleep. 60 tablet 0  . valACYclovir (VALTREX) 1000 MG tablet 1/2 po qd for suppression and increase to 1 pill bid for outbreak 180 tablet 3   No current facility-administered medications on file prior to visit.   Allergies  Allergen Reactions  . Penicillins Other (See Comments)    Blisters on the soles and her feet     Review of Systems  Constitutional: Positive for activity change, appetite change and fatigue. Negative for fever, chills, diaphoresis and unexpected weight change.  Cardiovascular: Positive for leg swelling.  Musculoskeletal:  Positive for joint swelling, arthralgias and gait problem. Negative for myalgias and back pain.  Psychiatric/Behavioral: Positive for sleep disturbance.       Objective:  BP 112/80 mmHg  Pulse 94  Temp(Src) 98 F (36.7 C) (Oral)  Resp 20  Ht 5' 4.5" (1.638 m)  Wt 236 lb (107.049 kg)  BMI 39.90 kg/m2  SpO2 97%  LMP 10/10/2014 (Exact Date)  Physical Exam  Constitutional: She is oriented to person, place, and time. She appears well-developed and well-nourished.  HENT:  Head: Normocephalic.  Eyes: Conjunctivae are normal. No scleral icterus.  Neck: Normal range of motion. Neck supple.  Cardiovascular: Normal rate, regular rhythm and normal heart sounds.   Pulmonary/Chest: Effort normal and  breath sounds normal. No respiratory distress.  Musculoskeletal: She exhibits edema and tenderness.       Right hip: Normal.       Left hip: Normal.       Lumbar back: She exhibits tenderness, pain and spasm. She exhibits normal range of motion, no bony tenderness and no deformity.  Neurological: She is alert and oriented to person, place, and time. She has normal strength and normal reflexes. No sensory deficit. She exhibits normal muscle tone. Coordination and gait normal.  Reflex Scores:      Patellar reflexes are 2+ on the right side and 2+ on the left side.      Achilles reflexes are 2+ on the right side and 2+ on the left side. Skin: Skin is warm and dry. No erythema.  Psychiatric: She has a normal mood and affect. Her behavior is normal.          Assessment & Plan:   1. Knee injury, right, initial encounter   Acute injury - start conservative management with RICE then gradually resume normal activity w/ hinged knee brace and crocs. If pain is worsening or persisiting, wil need imaging next.  Meds ordered this encounter  Medications  . nabumetone (RELAFEN) 500 MG tablet    Sig: Take 1-2 tablets (500-1,000 mg total) by mouth 2 (two) times daily as needed.    Dispense:  60 tablet    Refill:  1  . HYDROcodone-acetaminophen (NORCO/VICODIN) 5-325 MG per tablet    Sig: Take 1-2 tablets by mouth every 6 (six) hours as needed for moderate pain.    Dispense:  30 tablet    Refill:  0    I personally performed the services described in this documentation, which was scribed in my presence. The recorded information has been reviewed and considered, and addended by me as needed.  Norberto Sorenson, MD MPH

## 2014-11-22 ENCOUNTER — Telehealth: Payer: Self-pay

## 2014-11-22 NOTE — Telephone Encounter (Signed)
LVM w/ cell #. Will try again tomorrow.

## 2014-11-22 NOTE — Telephone Encounter (Signed)
Pt hurt her knee again and wants to know if you could give her a call at your earliest convenience please. Thanks

## 2014-12-03 ENCOUNTER — Telehealth: Payer: Self-pay | Admitting: Radiology

## 2014-12-03 NOTE — Telephone Encounter (Signed)
I have spoken to patient she c/o fatigue, day time sleepiness thinks from her Thyroid. Would like to discuss with Benny LennertSarah Weber please call on Cell

## 2014-12-06 NOTE — Telephone Encounter (Signed)
Called an spoke with patient.  Sweating a lot.  Not sleeping.  Feels like her depression is worse.  She will plan on seeing me next week.  She will increase her Lexapro to 30mg  qd.

## 2014-12-17 ENCOUNTER — Ambulatory Visit (INDEPENDENT_AMBULATORY_CARE_PROVIDER_SITE_OTHER): Payer: PRIVATE HEALTH INSURANCE | Admitting: Family Medicine

## 2014-12-17 VITALS — Resp 16 | Ht 64.5 in | Wt 244.0 lb

## 2014-12-17 DIAGNOSIS — M2391 Unspecified internal derangement of right knee: Secondary | ICD-10-CM

## 2014-12-17 DIAGNOSIS — O10019 Pre-existing essential hypertension complicating pregnancy, unspecified trimester: Secondary | ICD-10-CM

## 2014-12-17 DIAGNOSIS — Z3201 Encounter for pregnancy test, result positive: Secondary | ICD-10-CM

## 2014-12-17 DIAGNOSIS — E063 Autoimmune thyroiditis: Secondary | ICD-10-CM

## 2014-12-17 DIAGNOSIS — O09529 Supervision of elderly multigravida, unspecified trimester: Secondary | ICD-10-CM

## 2014-12-17 LAB — POCT CBC
Granulocyte percent: 65.5 %G (ref 37–80)
HCT, POC: 45.1 % (ref 37.7–47.9)
Hemoglobin: 14.9 g/dL (ref 12.2–16.2)
LYMPH, POC: 3.4 (ref 0.6–3.4)
MCH: 31.6 pg — AB (ref 27–31.2)
MCHC: 33 g/dL (ref 31.8–35.4)
MCV: 95.7 fL (ref 80–97)
MID (CBC): 0.5 (ref 0–0.9)
MPV: 7.3 fL (ref 0–99.8)
PLATELET COUNT, POC: 283 10*3/uL (ref 142–424)
POC Granulocyte: 7.5 — AB (ref 2–6.9)
POC LYMPH %: 30.2 % (ref 10–50)
POC MID %: 4.3 %M (ref 0–12)
RBC: 4.71 M/uL (ref 4.04–5.48)
RDW, POC: 13.7 %
WBC: 11.4 10*3/uL — AB (ref 4.6–10.2)

## 2014-12-17 LAB — POCT URINE PREGNANCY: Preg Test, Ur: POSITIVE — AB

## 2014-12-17 LAB — HCG, QUANTITATIVE, PREGNANCY: hCG, Beta Chain, Quant, S: 10694.4 m[IU]/mL

## 2014-12-17 MED ORDER — CLOTRIMAZOLE 1 % EX CREA: 1.0000 "application " | TOPICAL_CREAM | Freq: Three times a day (TID) | CUTANEOUS | Status: DC

## 2014-12-17 MED ORDER — PRENATAL MULTIVITAMIN CH: 1.0000 | ORAL_TABLET | Freq: Every day | ORAL | Status: DC

## 2014-12-17 NOTE — Patient Instructions (Signed)
Continue on your hctz and your lexapro.  Continue on your buspar for now - we may want to wean you off of this first.  Start your prenatal vitamin. Lots of water, no sugary drinks.  Start your prenatal gummies - make sure you are getting >800mg  of folate a day with this.  You do not need any iron supp for now.  Medicines During Pregnancy During pregnancy, there are medicines that are either safe or unsafe to take. Medicines include prescriptions from your caregiver, over-the-counter medicines, topical creams applied to the skin, and all herbal substances. Medicines are put into either Class A, B, C, or D. Class A and B medicines have been shown to be safe in pregnancy. Class C medicines are also considered to be safe in pregnancy, but these medicines should only be used when necessary. Class D medicines should not be used at all in pregnancy. They can be harmful to a baby.  It is best to take as little medicine as possible while pregnant. However, some medicines are necessary to take for the mother and baby's health. Sometimes, it is more dangerous to stop taking certain medicines than to stay on them. This is often the case for people with long-term (chronic) conditions such as asthma, diabetes, or high blood pressure (hypertension). If you are pregnant and have a chronic illness, call your caregiver right away. Bring a list of your medicines and their doses to your appointments. If you are planning to become pregnant, schedule a doctor's appointment and discuss your medicines with your caregiver. Lastly, write down the phone number to your pharmacist. They can answer questions regarding a medicine's class and safety. They cannot give advice as to whether you should or should not be on a medicine.  SAFE AND UNSAFE MEDICINES There is a long list of medicines that are considered safe in pregnancy. Below is a shorter list. For specific medicines, ask your caregiver.  AllergyMedicines Loratadine, cetirizine,  and chlorpheniramine are safe to take. Certain nasal steroid sprays are safe. Talk to your caregiver about specific brands that are safe. Analgesics Acetaminophen and acetaminophen with codeine are safe to take. All other nonsteroidal anti-inflammatory drugs (NSAIDS) are not safe. This includes ibuprofen.  Antacids Many over-the-counter antacids are safe to take. Talk to your caregiver about specific brands that are safe. Famotidine, ranitidine, and lansoprazole are safe. Omepresole is considered safe to take in the second trimester. Antibiotic Medicines There are several antibiotics to avoid. These include, but are not limited to, tetracyline, quinolones, and sulfa medications. Talk to your caregiver before taking any antibiotic.  Antihistamines Talk to your caregiver about specific brands that are safe.  Asthma Medicines Most asthma steroid inhalers are safe to take. Talk to your caregiver for specific details. Calcium Calcium supplements are safe to take. Do not take oyster shell calcium.  Cough and Cold Medicines It is safe to take products with guaifenesin or dextromethorphan. Talk to your caregiver about specific brands that are safe. It is not safe to take products that contain aspirin or ibuprofen. Decongestant Medicines Pseudoephedrine-containing products are safe to take in the second and third trimester.  Depression Medicines Talk about these medicines with your caregiver.  Antidiarrheal Medicines It is safe to take loperamide. Talk to your caregiver about specific brands that are safe. It is not safe to take any antidiarrheal medicine that contains bismuth. Eyedrops Allergy eyedrops should be limited.  Iron It is safe to use certain iron-containing medicines for anemia in pregnancy. They require a  prescription.  Antinausea Medicines It is safe to take doxylamine and vitamin B6 as directed. There are other prescription medicines available, if needed.  Sleep aids It  is safe to take diphenhydramine and acetaminophen with diphenhydramine.  Steroids Hydrocortisone creams are safe to use as directed. Oral steroids require a prescription. It is not safe to take any hemorrhoid cream with pramoxine or phenylephrine. Stool softener It is safe to take stool softener medicines. Avoid daily or prolonged use of stool softeners. Thyroid Medicine It is important to stay on this thyroid medicine. It needs to be followed by your caregiver.  Vaginal Medicines Your caregiver will prescribe a medicine to you if you have a vaginal infection. Certain antifungal medicines are safe to use if you have a sexually transmitted infection (STI). Talk to your caregiver.  Document Released: 06/22/2005 Document Revised: 09/14/2011 Document Reviewed: 06/23/2011 [facility] Patient Information 2015 [place], [place]. This information is not intended to replace advice given to you by your health care provider. Make sure you discuss any questions you have with your health care provider.  First Trimester of Pregnancy The first trimester of pregnancy is from week 1 until the end of week 12 (months 1 through 3). A week after a sperm fertilizes an egg, the egg will implant on the wall of the uterus. This embryo will begin to develop into a baby. Genes from you and your partner are forming the baby. The female genes determine whether the baby is a boy or a girl. At 6-8 weeks, the eyes and face are formed, and the heartbeat can be seen on ultrasound. At the end of 12 weeks, all the baby's organs are formed.  Now that you are pregnant, you will want to do everything you can to have a healthy baby. Two of the most important things are to get good prenatal care and to follow your health care provider's instructions. Prenatal care is all the medical care you receive before the baby's birth. This care will help prevent, find, and treat any problems during the pregnancy and childbirth. BODY CHANGES Your body  goes through many changes during pregnancy. The changes vary from woman to woman.   You may gain or lose a couple of pounds at first.  You may feel sick to your stomach (nauseous) and throw up (vomit). If the vomiting is uncontrollable, call your health care provider.  You may tire easily.  You may develop headaches that can be relieved by medicines approved by your health care provider.  You may urinate more often. Painful urination may mean you have a bladder infection.  You may develop heartburn as a result of your pregnancy.  You may develop constipation because certain hormones are causing the muscles that push waste through your intestines to slow down.  You may develop hemorrhoids or swollen, bulging veins (varicose veins).  Your breasts may begin to grow larger and become tender. Your nipples may stick out more, and the tissue that surrounds them (areola) may become darker.  Your gums may bleed and may be sensitive to brushing and flossing.  Dark spots or blotches (chloasma, mask of pregnancy) may develop on your face. This will likely fade after the baby is born.  Your menstrual periods will stop.  You may have a loss of appetite.  You may develop cravings for certain kinds of food.  You may have changes in your emotions from day to day, such as being excited to be pregnant or being concerned that something may go  wrong with the pregnancy and baby.  You may have more vivid and strange dreams.  You may have changes in your hair. These can include thickening of your hair, rapid growth, and changes in texture. Some women also have hair loss during or after pregnancy, or hair that feels dry or thin. Your hair will most likely return to normal after your baby is born. WHAT TO EXPECT AT YOUR PRENATAL VISITS During a routine prenatal visit:  You will be weighed to make sure you and the baby are growing normally.  Your blood pressure will be taken.  Your abdomen will be  measured to track your baby's growth.  The fetal heartbeat will be listened to starting around week 10 or 12 of your pregnancy.  Test results from any previous visits will be discussed. Your health care provider may ask you:  How you are feeling.  If you are feeling the baby move.  If you have had any abnormal symptoms, such as leaking fluid, bleeding, severe headaches, or abdominal cramping.  If you have any questions. Other tests that may be performed during your first trimester include:  Blood tests to find your blood type and to check for the presence of any previous infections. They will also be used to check for low iron levels (anemia) and Rh antibodies. Later in the pregnancy, blood tests for diabetes will be done along with other tests if problems develop.  Urine tests to check for infections, diabetes, or protein in the urine.  An ultrasound to confirm the proper growth and development of the baby.  An amniocentesis to check for possible genetic problems.  Fetal screens for spina bifida and Down syndrome.  You may need other tests to make sure you and the baby are doing well. HOME CARE INSTRUCTIONS  Medicines  Follow your health care provider's instructions regarding medicine use. Specific medicines may be either safe or unsafe to take during pregnancy.  Take your prenatal vitamins as directed.  If you develop constipation, try taking a stool softener if your health care provider approves. Diet  Eat regular, well-balanced meals. Choose a variety of foods, such as meat or vegetable-based protein, fish, milk and low-fat dairy products, vegetables, fruits, and whole grain breads and cereals. Your health care provider will help you determine the amount of weight gain that is right for you.  Avoid raw meat and uncooked cheese. These carry germs that can cause birth defects in the baby.  Eating four or five small meals rather than three large meals a day may help relieve  nausea and vomiting. If you start to feel nauseous, eating a few soda crackers can be helpful. Drinking liquids between meals instead of during meals also seems to help nausea and vomiting.  If you develop constipation, eat more high-fiber foods, such as fresh vegetables or fruit and whole grains. Drink enough fluids to keep your urine clear or pale yellow. Activity and Exercise  Exercise only as directed by your health care provider. Exercising will help you:  Control your weight.  Stay in shape.  Be prepared for labor and delivery.  Experiencing pain or cramping in the lower abdomen or low back is a good sign that you should stop exercising. Check with your health care provider before continuing normal exercises.  Try to avoid standing for long periods of time. Move your legs often if you must stand in one place for a long time.  Avoid heavy lifting.  Wear low-heeled shoes, and practice good  posture.  You may continue to have sex unless your health care provider directs you otherwise. Relief of Pain or Discomfort  Wear a good support bra for breast tenderness.   Take warm sitz baths to soothe any pain or discomfort caused by hemorrhoids. Use hemorrhoid cream if your health care provider approves.   Rest with your legs elevated if you have leg cramps or low back pain.  If you develop varicose veins in your legs, wear support hose. Elevate your feet for 15 minutes, 3-4 times a day. Limit salt in your diet. Prenatal Care  Schedule your prenatal visits by the twelfth week of pregnancy. They are usually scheduled monthly at first, then more often in the last 2 months before delivery.  Write down your questions. Take them to your prenatal visits.  Keep all your prenatal visits as directed by your health care provider. Safety  Wear your seat belt at all times when driving.  Make a list of emergency phone numbers, including numbers for family, friends, the hospital, and police  and fire departments. General Tips  Ask your health care provider for a referral to a local prenatal education class. Begin classes no later than at the beginning of month 6 of your pregnancy.  Ask for help if you have counseling or nutritional needs during pregnancy. Your health care provider can offer advice or refer you to specialists for help with various needs.  Do not use hot tubs, steam rooms, or saunas.  Do not douche or use tampons or scented sanitary pads.  Do not cross your legs for long periods of time.  Avoid cat litter boxes and soil used by cats. These carry germs that can cause birth defects in the baby and possibly loss of the fetus by miscarriage or stillbirth.  Avoid all smoking, herbs, alcohol, and medicines not prescribed by your health care provider. Chemicals in these affect the formation and growth of the baby.  Schedule a dentist appointment. At home, brush your teeth with a soft toothbrush and be gentle when you floss. SEEK MEDICAL CARE IF:   You have dizziness.  You have mild pelvic cramps, pelvic pressure, or nagging pain in the abdominal area.  You have persistent nausea, vomiting, or diarrhea.  You have a bad smelling vaginal discharge.  You have pain with urination.  You notice increased swelling in your face, hands, legs, or ankles. SEEK IMMEDIATE MEDICAL CARE IF:   You have a fever.  You are leaking fluid from your vagina.  You have spotting or bleeding from your vagina.  You have severe abdominal cramping or pain.  You have rapid weight gain or loss.  You vomit blood or material that looks like coffee grounds.  You are exposed to [place] measles and have never had them.  You are exposed to fifth disease or chickenpox.  You develop a severe headache.  You have shortness of breath.  You have any kind of trauma, such as from a fall or a car accident. Document Released: 06/16/2001 Document Revised: 11/06/2013 Document Reviewed:  05/02/2013 [facility] Patient Information 2015 [place], [place]. This information is not intended to replace advice given to you by your health care provider. Make sure you discuss any questions you have with your health care provider.

## 2014-12-17 NOTE — Progress Notes (Addendum)
Subjective:   This chart was scribed for Dr. Norberto Sorenson, MD by Jarvis Morgan, ED Scribe. This patient was seen in Room 5 and the patient's care was started at 6:29 PM.   Patient ID: Alexis French, female    DOB: Jun 25, 1976, 39 y.o.   MRN: [id number]  Chief Complaint  Patient presents with  . Possible Pregnancy  . Rash  . Tinea   HPI HPI Comments: Alexis French is a 39 y.o. female who presents to the [facility] for a follow up of an injury to her left knee onset 6 weeks ago. Pt was playing football at the time of injury and she planted her left foot and pivoted on her left knee when she heard a pop. Pt was unable to bear weight on the knee immediately after the injury. Pt had a previous injury to left meniscus. At last visit she was recommended start conservative management with RICE then gradually resume normal activity w/ hinged knee brace and crocs. Was told if pain is worsening or persisiting, she will need to pursue imaging. She was prescribed Relafen and Vicodin. She states she is still having intermittent swelling to the knee. She states sometimes she hears it popping with ambulation. She reports the knee does not lock up on her but states it sometimes will give out on her. She notes she is not able to fully extend her left knee.   She notes that she has had 3 confirmed at home pregnancy tests over the past 2 days. She states she has not been taking her BC regularly. She was worried with her recent knee issues she could be at risk for a DVT and did not want to take the [facility] medication.  Pt has some mild concern because she is having left pelvic pain. She notes associated nausea but denies any vomiting. Pt regularly takes Lexapro for her anxiety. Her LNMP was 09/08/14. She denies any vaginal bleeding.   Pt has a 3rd complaint of a round, red rash to the lateral side of left knee. She believes this to be ringworm and would like to get treatment for it.    Patient Active  Problem List   Diagnosis Date Noted  . Hashimoto's disease 06/14/2014  . Obesity, Class I, BMI 30-34.9 05/01/2013  . Immune to varicella 12/15/2012  . Immune to measles 12/15/2012  . Immune to mumps 12/15/2012  . Immune to rubella 12/15/2012  . Hypothyroidism 01/27/2012  . ADD (attention deficit disorder) 12/08/2011  . HTN (hypertension) 12/08/2011  . Anxiety and depression 12/08/2011   Past Medical History  Diagnosis Date  . Hypertension   . STD (sexually transmitted disease)     HSV2  . Attention deficit disorder (ADD)   . Anxiety   . Thyroid disease     Hypothyroid  . ASCUS (atypical squamous cells of undetermined significance) on Pap smear   . Allergy   . Cervical high risk human papillomavirus (HPV) DNA test positive 09/2012    Positive high risk HPV screen, negative subtypes 16/18   Past Surgical History  Procedure Laterality Date  . Dilation and curettage of uterus  APPROX. 2006  . Colposcopy     Allergies  Allergen Reactions  . Penicillins Other (See Comments)    Blisters on the soles and her feet   Prior to Admission medications   Medication Sig Start Date End Date Taking? Authorizing Provider  ALPRAZolam Prudy Feeler) 1 MG tablet Take 1 tablet (1 mg  total) by mouth 2 (two) times daily as needed. 10/20/14   Morrell Riddle, PA-C  amphetamine-dextroamphetamine (ADDERALL XR) 15 MG 24 hr capsule Take 1 capsule by mouth every morning. 10/20/14   Morrell Riddle, PA-C  b complex vitamins tablet Take 1 tablet by mouth daily.    Historical Provider, MD  BIOTIN 5000 PO Take 2 capsules by mouth daily.    Historical Provider, MD  busPIRone (BUSPAR) 15 MG tablet TAKE 2 TABLETS BY MOUTH IN THE MORNING AND 1 TABLET EVERY AFTERNOON 10/20/14   Morrell Riddle, PA-C  desogestrel-ethinyl estradiol (APRI) 0.15-30 MG-MCG tablet Take 1 tablet by mouth daily. 10/20/14   Morrell Riddle, PA-C  diclofenac sodium (VOLTAREN) 1 % GEL Apply 2 g topically 4 (four) times daily. 10/20/14   Morrell Riddle, PA-C    escitalopram (LEXAPRO) 20 MG tablet Take 1 tablet (20 mg total) by mouth daily. 10/20/14   Morrell Riddle, PA-C  fexofenadine (ALLEGRA) 180 MG tablet Take 1 tablet (180 mg total) by mouth daily. 10/20/14   Morrell Riddle, PA-C  hydrochlorothiazide (HYDRODIURIL) 25 MG tablet Take 1-2 tablets (25-50 mg total) by mouth daily. TAKE 1 TABLET BY MOUTH ONCE DAILY 10/20/14   Morrell Riddle, PA-C  HYDROcodone-acetaminophen (NORCO/VICODIN) 5-325 MG per tablet Take 1-2 tablets by mouth every 6 (six) hours as needed for moderate pain. 11/03/14   Sherren Mocha, MD  levothyroxine (SYNTHROID, LEVOTHROID) 112 MCG tablet Take 2 tablets (224 mcg total) by mouth daily. 06/21/14   Morrell Riddle, PA-C  mometasone (NASONEX) 50 MCG/ACT nasal spray Place 2 sprays into the nose daily. 10/20/14   Morrell Riddle, PA-C  Multiple Vitamin (MULTIVITAMIN) tablet Take 1 tablet by mouth daily.    Historical Provider, MD  nabumetone (RELAFEN) 500 MG tablet Take 1-2 tablets (500-1,000 mg total) by mouth 2 (two) times daily as needed. 11/03/14   Sherren Mocha, MD  SYNTHROID 175 MCG tablet Take 1 tablet (175 mcg total) by mouth daily before breakfast. 10/20/14   Morrell Riddle, PA-C  traZODone (DESYREL) 50 MG tablet Take 0.5-2 tablets (25-100 mg total) by mouth at bedtime as needed for sleep. 10/20/14   Morrell Riddle, PA-C  valACYclovir (VALTREX) 1000 MG tablet 1/2 po qd for suppression and increase to 1 pill bid for outbreak 10/20/14   Morrell Riddle, PA-C   History   Social History  . Marital Status: Single    Spouse Name: N/A  . Number of Children: N/A  . Years of Education: N/A   Occupational History  . Not on file.   Social History Main Topics  . Smoking status: Never Smoker   . Smokeless tobacco: Never Used  . Alcohol Use: 1.8 oz/week    3 Glasses of wine per week     Comment: 12 pack beer a week  . Drug Use: No  . Sexual Activity:    Partners: Male    Birth Control/ Protection: Pill   Other Topics Concern  . Not on file   Social  History Narrative   CMA       Review of Systems  Constitutional: Positive for diaphoresis, activity change, appetite change and fatigue. Negative for fever.  Cardiovascular: Positive for leg swelling.  Gastrointestinal: Positive for nausea and abdominal pain. Negative for vomiting and abdominal distention.  Genitourinary: Positive for pelvic pain. Negative for dysuria, vaginal bleeding, menstrual problem and dyspareunia.  Musculoskeletal: Positive for joint swelling, arthralgias and gait problem.  Skin:  Positive for color change and rash (ringworm).  Neurological: Negative for weakness and numbness.  Psychiatric/Behavioral: Positive for dysphoric mood. The patient is nervous/anxious.        Objective:  Resp 16  Ht 5' 4.5" (1.638 m)  Wt 244 lb (110.678 kg)  BMI 41.25 kg/m2    Physical Exam  Constitutional: She is oriented to person, place, and time. She appears well-developed and well-nourished. No distress.  HENT:  Head: Normocephalic and atraumatic.  Eyes: Conjunctivae and EOM are normal.  Neck: Neck supple. No tracheal deviation present.  Cardiovascular: Normal rate, regular rhythm, S1 normal, S2 normal and normal heart sounds.   No murmur heard. Pulmonary/Chest: Effort normal and breath sounds normal. No respiratory distress. She has no wheezes.  Abdominal: There is tenderness in the right lower quadrant and left lower quadrant.  Musculoskeletal: Normal range of motion.       Left knee: She exhibits MCL laxity.  Left Knee: positive McMurray's with medial compression  Neurological: She is alert and oriented to person, place, and time.  Skin: Skin is warm and dry.  Psychiatric: She has a normal mood and affect. Her behavior is normal.  Nursing note and vitals reviewed. BP CHECK on left arm with large cuff 130/70  Results for orders placed or performed in visit on 12/17/14  POCT CBC  Result Value Ref Range   WBC 11.4 (A) 4.6 - 10.2 K/uL   Lymph, poc 3.4 0.6 - 3.4   POC  LYMPH PERCENT 30.2 10 - 50 %L   MID (cbc) 0.5 0 - 0.9   POC MID % 4.3 0 - 12 %M   POC Granulocyte 7.5 (A) 2 - 6.9   Granulocyte percent 65.5 37 - 80 %G   RBC 4.71 4.04 - 5.48 M/uL   Hemoglobin 14.9 12.2 - 16.2 g/dL   HCT, POC 16.1 09.6 - 47.9 %   MCV 95.7 80 - 97 fL   MCH, POC 31.6 (A) 27 - 31.2 pg   MCHC 33.0 31.8 - 35.4 g/dL   RDW, POC 04.5 %   Platelet Count, POC 283 142 - 424 K/uL   MPV 7.3 0 - 99.8 fL  POCT urine pregnancy  Result Value Ref Range   Preg Test, Ur Positive (A) Negative      Assessment & Plan:   1. Positive pregnancy test - CONGRATS!  Est w/ ob asap - maybe high risk as unplanned and pt on a sig amount of medication as well as HTN and higher likelihood of gestational DM due obesity - cont lexapro, hctz, and buspar for now - pt reassured that lexapro and buspar w/o any concern for teratogenic effects - start pnv, minimize otc meds. Not using adderall or ativan. Pt concerned about her recent alcohol use - has been binge drinking and smoked sev cigarettes recently - provided reassurance that likely no effect as so early in preg but reminded no safe amount ever in preg.  Will likely be candidate for early monitoring/testing due to AMA which will hopefully also help pt be less anxious over exposures. LMP 11/07/14 - accurate as pt tracks on her phone so EDD 08/13/14 and hcg consistent w/ pt being 5 wks and 5d gestation.  2. Hashimoto's disease -- check tfts today to confirm dose -has been very difficult to control and requires freq monitoring/dose adjustments w/ pt freq symptomatic of hyper-and hypo-,  - was decreased to 175 from 224 two mos prior when tsh persisted in being quite supressed at  0.06 - was at 30 4 mos prior, has seen endocrine  reminded pt that this will need freq monitoring as requirements may change over course of preg.  tsh now at 15 on so will increase to - medially necessary for brand name only since pt is so sensitive to med variations.  3.  Internal derangement of knee joint, right - hold off on x-ray as no sig concern for bony injury and pt w/ first trimester preg so no unncessary radiation indicated - proceed w/ MRI due to persistent concern for meniscal inj vs MCL - need to order imaging through a facility that works with [facility] - cont hinged knee brace at work w/ prn tylenol.   4.    Polypharmacy - cont hctz, lexapro, buspar  Orders Placed This Encounter  Procedures  . MR Knee Right Wo Contrast    Standing Status: Future     Number of Occurrences:      Standing Expiration Date: 02/16/2016    Scheduling Instructions:     Please schedule with [facility] imaging asap.    Order Specific Question:  Reason for Exam (SYMPTOM  OR DIAGNOSIS REQUIRED)    Answer:  concern for medial meniscal tear    Order Specific Question:  Preferred imaging location?    Answer:  External    Order Specific Question:  Does the patient have a pacemaker or implanted devices?    Answer:  No    Order Specific Question:  What is the patient's sedation requirement?    Answer:  No Sedation  . Thyroid Panel With TSH  . hCG, quantitative, pregnancy  . POCT CBC  . POCT urine pregnancy    Meds ordered this encounter  Medications  . Prenatal Vit-Fe Fumarate-FA (PRENATAL MULTIVITAMIN) TABS tablet    Sig: Take 1 tablet by mouth daily at 12 noon.    Dispense:  90 tablet    Refill:  3  . DISCONTD: clotrimazole (LOTRIMIN) 1 % cream    Sig: Apply 1 application topically 3 (three) times daily.    Dispense:  60 g    Refill:  0  . clotrimazole (LOTRIMIN) 1 % cream    Sig: Apply 1 application topically 3 (three) times daily.    Dispense:  60 g    Refill:  0    I personally performed the services described in this documentation, which was scribed in my presence. The recorded information has been reviewed and considered, and addended by me as needed.  Norberto Sorenson, MD MPH

## 2014-12-18 ENCOUNTER — Telehealth: Payer: Self-pay | Admitting: Physician Assistant

## 2014-12-18 DIAGNOSIS — F329 Major depressive disorder, single episode, unspecified: Secondary | ICD-10-CM

## 2014-12-18 DIAGNOSIS — F32A Depression, unspecified: Secondary | ICD-10-CM

## 2014-12-18 DIAGNOSIS — F419 Anxiety disorder, unspecified: Principal | ICD-10-CM

## 2014-12-19 ENCOUNTER — Encounter: Payer: Self-pay | Admitting: Physician Assistant

## 2014-12-19 ENCOUNTER — Telehealth: Payer: Self-pay

## 2014-12-19 LAB — THYROID PANEL WITH TSH
Free Thyroxine Index: 2.5 (ref 1.4–3.8)
T3 Uptake: 26 % (ref 22–35)
T4, Total: 9.8 ug/dL (ref 4.5–12.0)
TSH: 14.822 u[IU]/mL — ABNORMAL HIGH (ref 0.350–4.500)

## 2014-12-19 MED ORDER — SYNTHROID 200 MCG PO TABS: 200.0000 ug | ORAL_TABLET | Freq: Every day | ORAL | Status: AC

## 2014-12-19 MED ORDER — ESCITALOPRAM OXALATE 20 MG PO TABS: 30.0000 mg | ORAL_TABLET | Freq: Every day | ORAL | Status: DC

## 2014-12-19 NOTE — Telephone Encounter (Signed)
Pt notifeid of TSH results.

## 2014-12-19 NOTE — Telephone Encounter (Signed)
Alexis French wants to know what you think about her TSH. Please advise. Thanks

## 2014-12-19 NOTE — Addendum Note (Signed)
Addended by: Norberto Sorenson on: 12/19/2014 10:20 AM   Modules accepted: Orders, Medications

## 2014-12-19 NOTE — Telephone Encounter (Signed)
Done

## 2015-01-03 ENCOUNTER — Telehealth: Payer: Self-pay

## 2015-01-03 MED ORDER — TERBINAFINE HCL 250 MG PO TABS: 250.0000 mg | ORAL_TABLET | Freq: Every day | ORAL | Status: DC

## 2015-01-03 NOTE — Telephone Encounter (Signed)
lamisil is category B in preg - same as tylenol - so hopefully that should clear it up

## 2015-01-03 NOTE — Telephone Encounter (Signed)
Dr. Clelia CroftShaw, Alexis French also wants to know if I can write her a note for work for the day after her last OV with you.

## 2015-01-03 NOTE — Telephone Encounter (Signed)
Unfortunately, there is not a lot of data about oral antifungal meds in preg - we can try terbinafine daily for 2-4 wks - stop as soon as sxs resolve. If she does not start to see sxs resolution within 1 week, then she should discontinue it. Low dose fluconazole could be tried but we do now of some harmful effects at high doses of this med which is why we will try the terbinafine instead.  At pharmacy.

## 2015-01-03 NOTE — Telephone Encounter (Signed)
Pt's ringworm is getting worse and spreading. The Lotrimin did not help. Is there a tablet she can take. Please advise. Thanks

## 2015-01-03 NOTE — Telephone Encounter (Signed)
Absolutely, whatever she needs, thanks for doing this for me.

## 2015-02-06 IMAGING — CR DG FOOT COMPLETE 3+V*L*
3 series · 3 of 3 positions shown · non-contrast
Comparison: None.

CLINICAL DATA: Left foot injury.  Initial evaluation.

EXAM:
LEFT FOOT - COMPLETE 3+ VIEW

[AP]
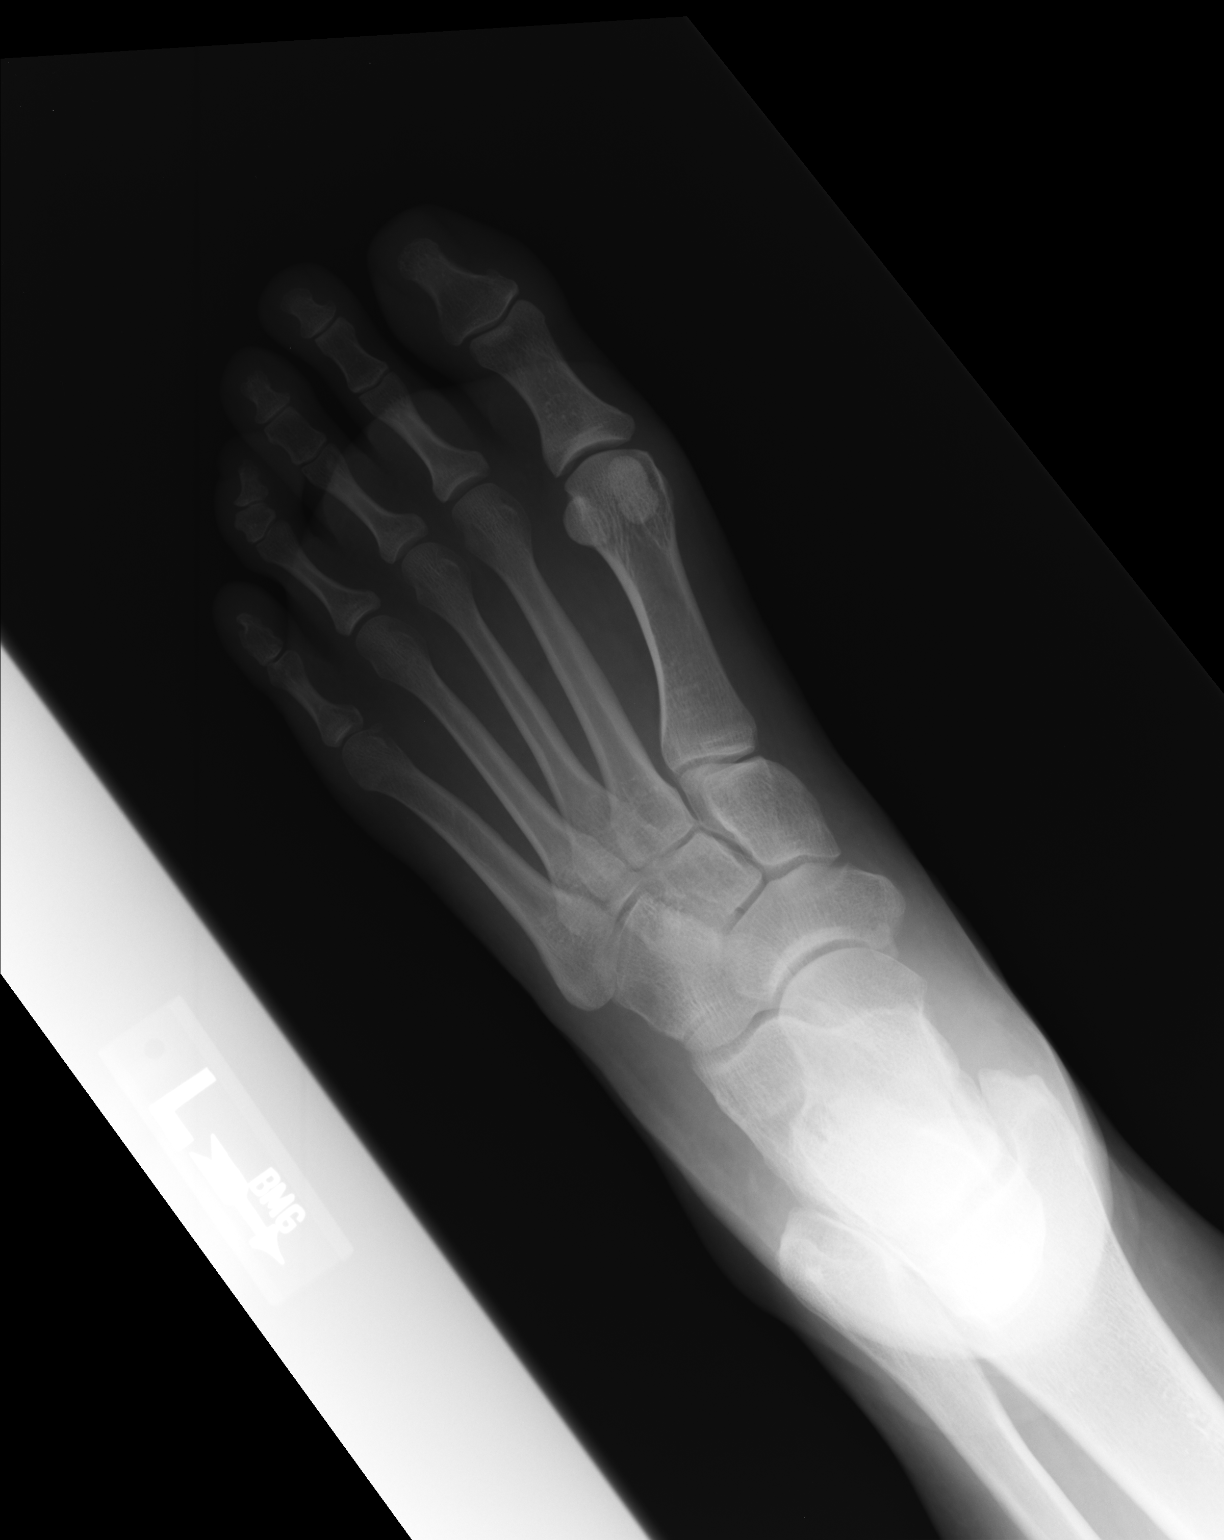

[ap obl int rot]
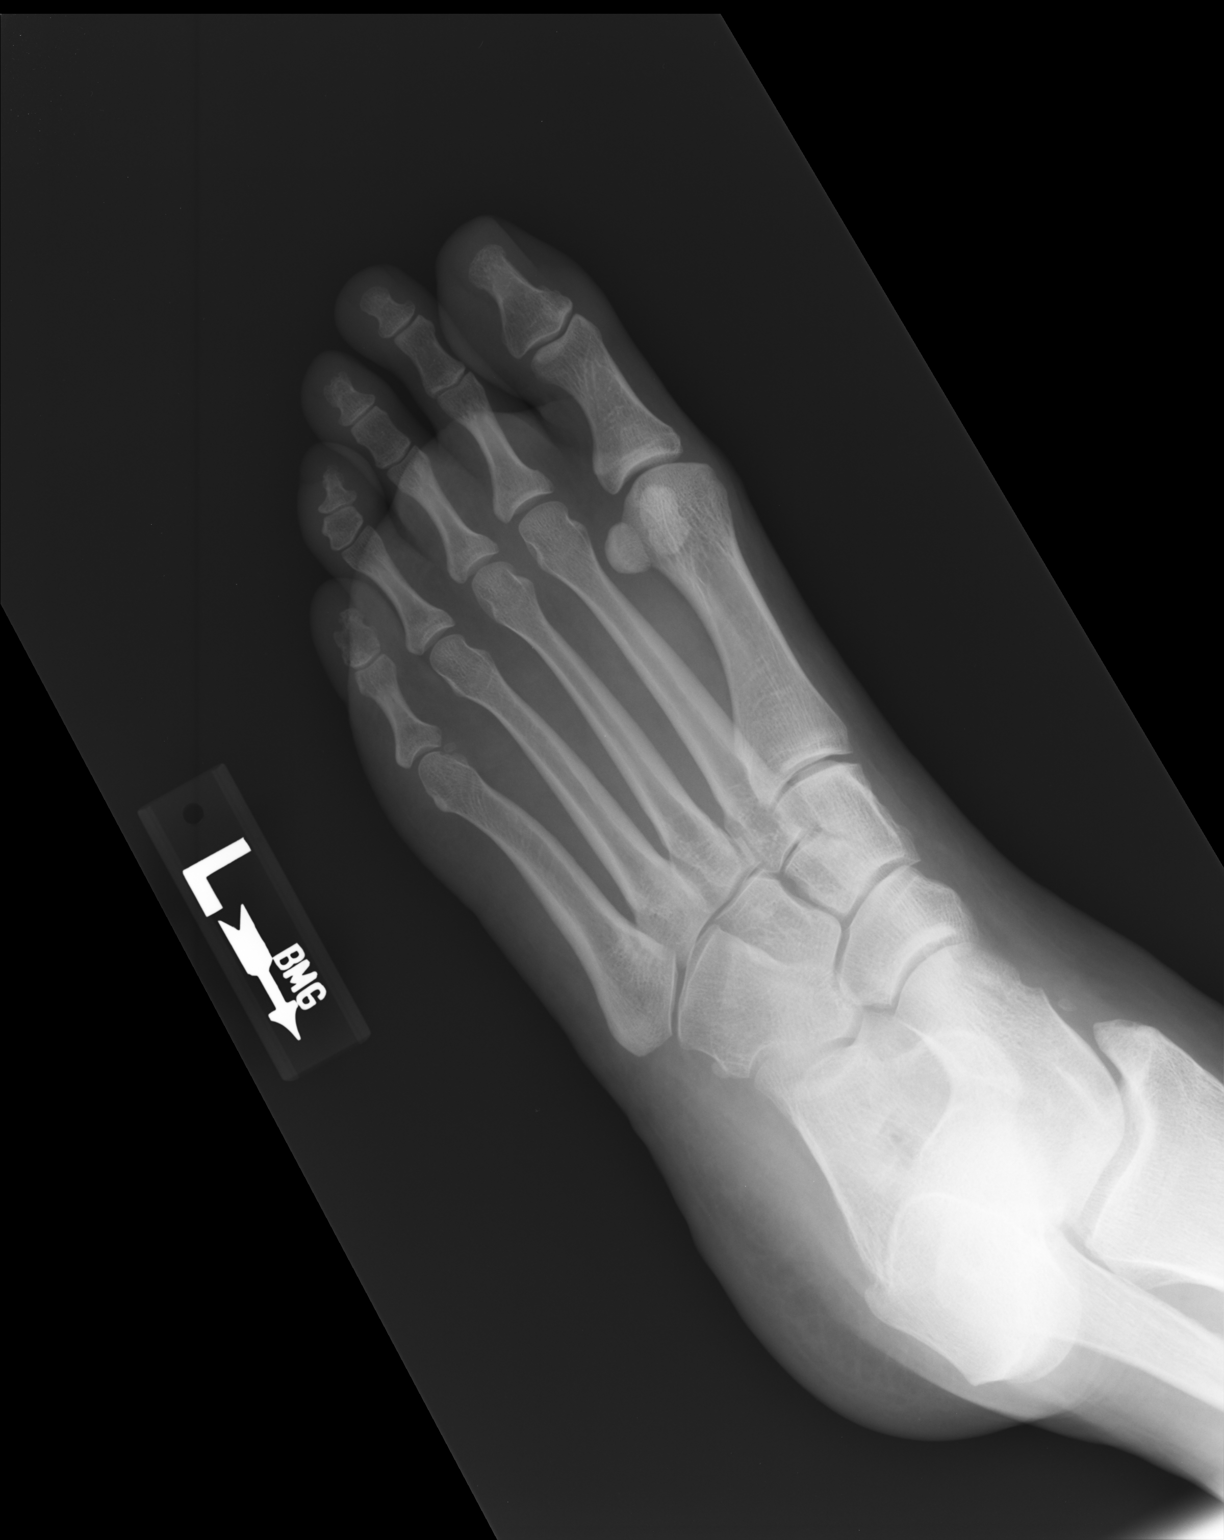

[lateral]
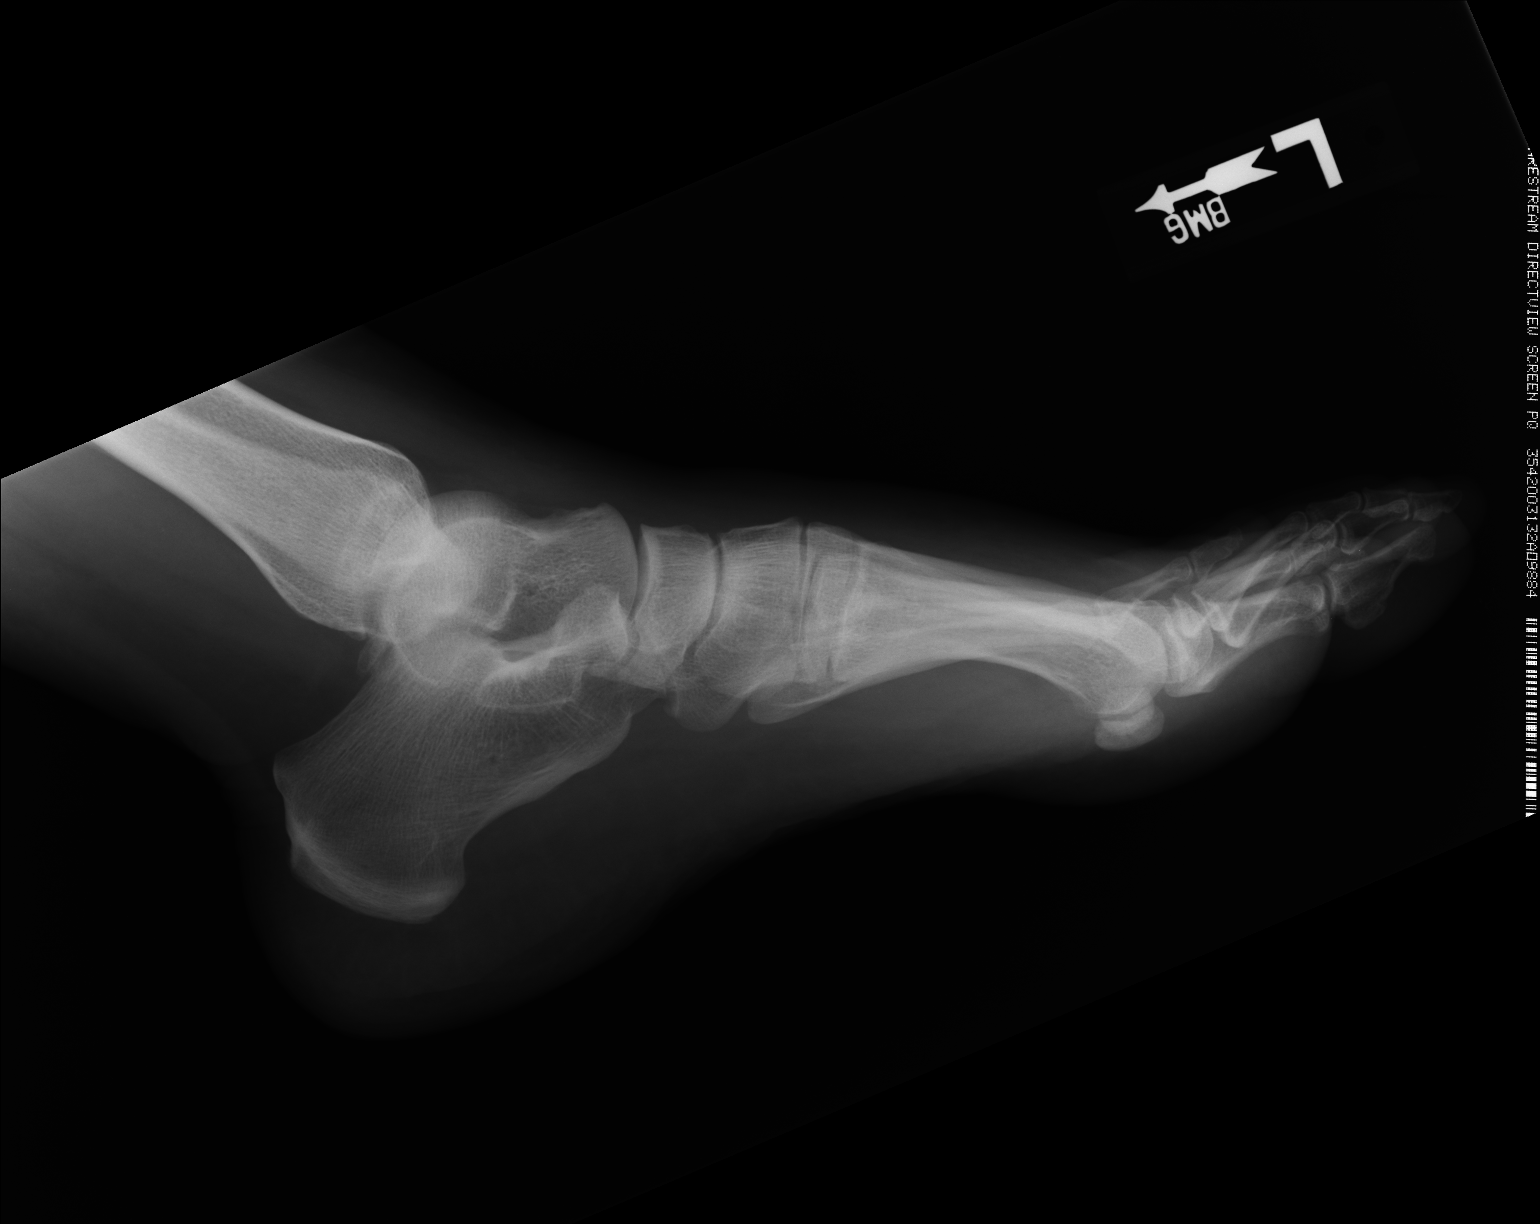

[3 of 3 positions shown; findings below may reference images not displayed]

FINDINGS: There is no evidence of fracture or dislocation. There is no
evidence of arthropathy or other focal bone abnormality. Soft
tissues are unremarkable.
IMPRESSION: No acute abnormality.

## 2015-02-27 ENCOUNTER — Telehealth: Payer: Self-pay

## 2015-02-27 DIAGNOSIS — F419 Anxiety disorder, unspecified: Principal | ICD-10-CM

## 2015-02-27 DIAGNOSIS — F32A Depression, unspecified: Secondary | ICD-10-CM

## 2015-02-27 DIAGNOSIS — F329 Major depressive disorder, single episode, unspecified: Secondary | ICD-10-CM

## 2015-02-27 NOTE — Telephone Encounter (Signed)
Pt needs a RF of HCT and Lexapro.

## 2015-02-28 MED ORDER — ESCITALOPRAM OXALATE 20 MG PO TABS: 30.0000 mg | ORAL_TABLET | Freq: Every day | ORAL | Status: DC

## 2015-02-28 NOTE — Telephone Encounter (Signed)
Done.  Pt will d/c her HCTZ - she is pregnant and she wants to breast feed - she is due in 08/2015

## 2015-04-08 ENCOUNTER — Encounter: Payer: Self-pay | Admitting: Family Medicine

## 2015-04-08 ENCOUNTER — Ambulatory Visit (INDEPENDENT_AMBULATORY_CARE_PROVIDER_SITE_OTHER): Payer: PRIVATE HEALTH INSURANCE | Admitting: Family Medicine

## 2015-04-08 VITALS — BP 122/90 | HR 93 | Temp 98.5°F | Resp 18 | Ht 64.5 in | Wt 256.0 lb

## 2015-04-08 DIAGNOSIS — J0110 Acute frontal sinusitis, unspecified: Secondary | ICD-10-CM | POA: Diagnosis not present

## 2015-04-08 DIAGNOSIS — J3489 Other specified disorders of nose and nasal sinuses: Secondary | ICD-10-CM

## 2015-04-08 DIAGNOSIS — O132 Gestational [pregnancy-induced] hypertension without significant proteinuria, second trimester: Secondary | ICD-10-CM

## 2015-04-08 DIAGNOSIS — Z23 Encounter for immunization: Secondary | ICD-10-CM

## 2015-04-08 DIAGNOSIS — O162 Unspecified maternal hypertension, second trimester: Secondary | ICD-10-CM

## 2015-04-08 LAB — POCT URINALYSIS DIP (MANUAL ENTRY)
BILIRUBIN UA: NEGATIVE
GLUCOSE UA: NEGATIVE
Ketones, POC UA: NEGATIVE
LEUKOCYTES UA: NEGATIVE
NITRITE UA: NEGATIVE
PH UA: 6.5
Protein Ur, POC: NEGATIVE
RBC UA: NEGATIVE
Spec Grav, UA: 1.01
UROBILINOGEN UA: 0.2

## 2015-04-08 LAB — POCT CBC
Granulocyte percent: 68.6 %G (ref 37–80)
HCT, POC: 38 % (ref 37.7–47.9)
HEMOGLOBIN: 12.8 g/dL (ref 12.2–16.2)
LYMPH, POC: 2.8 (ref 0.6–3.4)
MCH: 31.4 pg — AB (ref 27–31.2)
MCHC: 33.8 g/dL (ref 31.8–35.4)
MCV: 92.9 fL (ref 80–97)
MID (CBC): 0.7 (ref 0–0.9)
MPV: 7.1 fL (ref 0–99.8)
PLATELET COUNT, POC: 329 10*3/uL (ref 142–424)
POC Granulocyte: 7.7 — AB (ref 2–6.9)
POC LYMPH PERCENT: 25 %L (ref 10–50)
POC MID %: 6.4 % (ref 0–12)
RBC: 4.09 M/uL (ref 4.04–5.48)
RDW, POC: 13.1 %
WBC: 11.2 10*3/uL — AB (ref 4.6–10.2)

## 2015-04-08 MED ORDER — AZITHROMYCIN 250 MG PO TABS: ORAL_TABLET | ORAL | Status: DC

## 2015-04-08 NOTE — Patient Instructions (Signed)
Use the azithromycin as directed for a sinus infection Let us know if you do not feel better soon Be sure to do the 24 hour urine for your OB-GYN as soon as you can

## 2015-04-08 NOTE — Progress Notes (Signed)
[facility] [address] [place] [address] [phone]  Date:  04/08/2015   Name:  Alexis French   DOB:  1975-10-22   MRN:  [id number]  PCP:  Arletta Bale    Chief Complaint: Headache and Dizziness   History of Present Illness:  Alexis French is a 38 y.o. very pleasant female patient who presents with the following:  Here today with illness.  She has noted pain in her ears, right more than left.  She also notes facial pressure, headache- this has been going on for 2 weeks She is [redacted] weeks pregnant now Her BP has been a little high lately per her report- she recalls running 148/90 at her OB 3 days ago She is to collect a 24 hour urine for protein but has not yet had a chance to do this This is her 2nd pregnancy- her twin boys are now 37 years old.  This pregnancy was a surprise but is welcome.  She is not aware of any fever.  She still has nausea and is using Diclegis which does help a ;pt  She is a pt at [employer].  States that she does not really have a PCP there, all OB pts are shared by all providers   She did have HTN prior to her pregnancy- she was on HCTZ. This was stopped prior to pregnancy  She reports good, consistent fetal movement, no loss of fluid, bleeding or cramping, no belly pain  Patient Active Problem List   Diagnosis Date Noted  . Hashimoto's disease 06/14/2014  . Obesity, Class I, BMI 30-34.9 05/01/2013  . Immune to varicella 12/15/2012  . Immune to measles 12/15/2012  . Immune to mumps 12/15/2012  . Immune to rubella 12/15/2012  . Hypothyroidism 01/27/2012  . ADD (attention deficit disorder) 12/08/2011  . HTN (hypertension) 12/08/2011  . Anxiety and depression 12/08/2011    Past Medical History  Diagnosis Date  . Hypertension   . STD (sexually transmitted disease)     HSV2  . Attention deficit disorder (ADD)   . Anxiety   . Thyroid disease     Hypothyroid  . ASCUS (atypical squamous cells of  undetermined significance) on Pap smear   . Allergy   . 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 Dilation and curettage of uterus  APPROX. 2006  . Colposcopy      Social History  Substance Use Topics  . Smoking status: Never Smoker   . Smokeless tobacco: Never Used  . Alcohol Use: 1.8 oz/week    3 Glasses of wine per week     Comment: 12 pack beer a week    Family History  Problem Relation Age of Onset  . Diabetes Mother   . Hypertension Mother   . Diabetes Sister   . Cancer Maternal Grandmother   . Cirrhosis Maternal Grandmother   . Hypertension Maternal Grandfather   . Arthritis Maternal Grandfather   . Heart attack Maternal Grandfather   . Diabetes Paternal Grandmother   . Heart attack Paternal Grandmother   . Cancer Paternal Grandmother   . Heart attack Paternal Grandfather   . Heart Problems Paternal Grandfather     Allergies  Allergen Reactions  . Penicillins Other (See Comments)    Blisters on the soles and her feet  Medication list has been reviewed and updated.  Current Outpatient Prescriptions on File Prior to Visit  Medication Sig Dispense Refill  . b complex vitamins tablet Take 1 tablet by mouth daily.    . busPIRone (BUSPAR) 15 MG tablet TAKE 2 TABLETS BY MOUTH IN THE MORNING AND 1 TABLET EVERY AFTERNOON 270 tablet 0  . escitalopram (LEXAPRO) 20 MG tablet Take 1.5 tablets (30 mg total) by mouth daily. 135 tablet 0  . mometasone (NASONEX) 50 MCG/ACT nasal spray Place 2 sprays into the nose daily. 17 g 12  . Prenatal Vit-Fe Fumarate-FA (PRENATAL MULTIVITAMIN) TABS tablet Take 1 tablet by mouth daily at 12 noon. 90 tablet 3  . SYNTHROID 200 MCG tablet Take 1 tablet (200 mcg total) by mouth daily before breakfast. Brand name medically necessary 90 tablet 0  . valACYclovir (VALTREX) 1000 MG tablet 1/2 po qd for  suppression and increase to 1 pill bid for outbreak 180 tablet 3  . ALPRAZolam (XANAX) 1 MG tablet Take 1 tablet (1 mg total) by mouth 2 (two) times daily as needed. (Patient not taking: Reported on 04/08/2015) 30 tablet 0  . amphetamine-dextroamphetamine (ADDERALL XR) 15 MG 24 hr capsule Take 1 capsule by mouth every morning. (Patient not taking: Reported on 04/08/2015) 30 capsule 0  . hydrochlorothiazide (HYDRODIURIL) 25 MG tablet Take 1-2 tablets (25-50 mg total) by mouth daily. TAKE 1 TABLET BY MOUTH ONCE DAILY (Patient not taking: Reported on 04/08/2015) 180 tablet 0   No current facility-administered medications on file prior to visit.    Review of Systems:  As per HPI- otherwise negative.   Physical Examination: Filed Vitals:   04/08/15 0844  BP: 139/97  Pulse: 93  Temp: 98.5 F (36.9 C)  Resp: 18   Filed Vitals:   04/08/15 0844  Height: 5' 4.5" (1.638 m)  Weight: 256 lb (116.121 kg)   Body mass index is 43.28 kg/(m^2). Ideal Body Weight: Weight in (lb) to have BMI = 25: 147.6  GEN: WDWN, NAD, Non-toxic, A & O x 3, overweight, looks well, pregnant  HEENT: Atraumatic, Normocephalic. Neck supple. No masses, No LAD.  Bilateral TM wnl, oropharynx normal.  PEERL,EOMI. Right nasal cavity is red, inflamed and with purulent discharge.  Sinuses are sensitive to gentle percussion Ears and Nose: No external deformity. CV: RRR, No M/G/R. No JVD. No thrill. No extra heart sounds. PULM: CTA B, no wheezes, crackles, rhonchi. No retractions. No resp. distress. No accessory muscle use. ABD: S, NT, ND EXTR: No c/c/e NEURO Normal gait.  PSYCH: Normally interactive. Conversant. Not depressed or anxious appearing.  Calm demeanor.   Results for orders placed or performed in visit on 04/08/15  POCT CBC  Result Value Ref Range   WBC 11.2 (A) 4.6 - 10.2 K/uL   Lymph, poc 2.8 0.6 - 3.4   POC LYMPH PERCENT 25.0 10 - 50 %L   MID (cbc) 0.7 0 - 0.9   POC MID % 6.4 0 - 12 %M   POC Granulocyte  7.7 (A) 2 - 6.9   Granulocyte percent 68.6 37 - 80 %G   RBC 4.09 4.04 - 5.48 M/uL   Hemoglobin 12.8 12.2 - 16.2 g/dL   HCT, POC 09.8 11.9 - 47.9 %   MCV 92.9 80 - 97 fL   MCH, POC 31.4 (A) 27 - 31.2 pg   MCHC 33.8 31.8 - 35.4 g/dL   RDW, POC 14.7 %   Platelet Count, POC 329 142 - 424 K/uL   MPV  7.1 0 - 99.8 fL  POCT urinalysis dipstick  Result Value Ref Range   Color, UA straw (A) yellow   Clarity, UA clear clear   Glucose, UA negative negative   Bilirubin, UA negative negative   Ketones, POC UA negative negative   Spec Grav, UA 1.010    Blood, UA negative negative   pH, UA 6.5    Protein Ur, POC negative negative   Urobilinogen, UA 0.2    Nitrite, UA Negative Negative   Leukocytes, UA Negative Negative   Assessment and Plan: Elevated blood pressure affecting pregnancy in second trimester, antepartum - Plan: POCT urinalysis dipstick  Sinus pain - Plan: POCT CBC, azithromycin (ZITHROMAX) 250 MG tablet, DISCONTINUED: azithromycin (ZITHROMAX) 250 MG tablet  Needs flu shot - Plan: Flu Vaccine QUAD 36+ mos IM  Acute frontal sinusitis, recurrence not specified - Plan: azithromycin (ZITHROMAX) 250 MG tablet   Here today seeking treatment for sinus pressure and pain,  She has a sinus infection which we will treat with azithromycin given her penicillin allergy.  Her BP is borderline but not at the level of requiring treatment, negative urine protein is reassuring She decided to have a flu shot today which is reasonable  Asked her to keep track of her BP at work so she can present some readings to her OB at her next visit She will let me know if any other concerns   Meds ordered this encounter  Medications  . DISCONTD: azithromycin (ZITHROMAX) 250 MG tablet    Sig: Use as a zpack    Dispense:  6 tablet    Refill:  0  . azithromycin (ZITHROMAX) 250 MG tablet    Sig: Use as a zpack    Dispense:  6 tablet    Refill:  0     Signed Abbe Amsterdam, MD  Next appt with OB on  on the 14th

## 2015-04-22 ENCOUNTER — Telehealth: Payer: Self-pay

## 2015-04-22 DIAGNOSIS — F329 Major depressive disorder, single episode, unspecified: Secondary | ICD-10-CM

## 2015-04-22 DIAGNOSIS — F32A Depression, unspecified: Secondary | ICD-10-CM

## 2015-04-22 DIAGNOSIS — F419 Anxiety disorder, unspecified: Principal | ICD-10-CM

## 2015-04-22 NOTE — Telephone Encounter (Signed)
Pt neds a RF of HCT and Buspar She is taking (2) 25mg  HCT qd for swelling and Buspar 30mg  BID

## 2015-04-23 MED ORDER — HYDROCHLOROTHIAZIDE 25 MG PO TABS: 25.0000 mg | ORAL_TABLET | Freq: Every day | ORAL | Status: DC

## 2015-04-23 MED ORDER — BUSPIRONE HCL 15 MG PO TABS: ORAL_TABLET | ORAL | Status: DC

## 2015-04-29 ENCOUNTER — Telehealth: Payer: Self-pay | Admitting: Family Medicine

## 2015-04-29 NOTE — Telephone Encounter (Signed)
Patient called to check status on refill rx. Notified they were sent in and no additional refills

## 2015-05-29 ENCOUNTER — Other Ambulatory Visit: Payer: Self-pay | Admitting: Physician Assistant

## 2015-06-03 ENCOUNTER — Other Ambulatory Visit: Payer: Self-pay | Admitting: Physician Assistant

## 2015-06-03 ENCOUNTER — Encounter: Payer: Self-pay | Admitting: Family Medicine

## 2015-06-03 NOTE — Telephone Encounter (Signed)
Dr Patsy Lager[place], at 10/3 OV w/you, pt reported her HCTZ had been stopped prior to pregnancy, but pt was to discuss w/OB at her visit 10/14. On 10/17, pt called in to request RFs of HCTZ and Buspar. Is it OK to give RFs of these, or do you want OB to manage?

## 2015-06-24 ENCOUNTER — Ambulatory Visit (INDEPENDENT_AMBULATORY_CARE_PROVIDER_SITE_OTHER): Payer: PRIVATE HEALTH INSURANCE | Admitting: Physician Assistant

## 2015-06-24 ENCOUNTER — Encounter: Payer: Self-pay | Admitting: Physician Assistant

## 2015-06-24 VITALS — BP 152/94 | HR 96 | Temp 98.4°F | Resp 16 | Ht 64.5 in | Wt 265.0 lb

## 2015-06-24 DIAGNOSIS — F329 Major depressive disorder, single episode, unspecified: Secondary | ICD-10-CM

## 2015-06-24 DIAGNOSIS — F418 Other specified anxiety disorders: Secondary | ICD-10-CM | POA: Diagnosis not present

## 2015-06-24 DIAGNOSIS — F32A Depression, unspecified: Secondary | ICD-10-CM

## 2015-06-24 DIAGNOSIS — F419 Anxiety disorder, unspecified: Principal | ICD-10-CM

## 2015-06-24 MED ORDER — ESCITALOPRAM OXALATE 20 MG PO TABS: 30.0000 mg | ORAL_TABLET | Freq: Every day | ORAL | Status: DC

## 2015-06-24 MED ORDER — BUSPIRONE HCL 15 MG PO TABS: ORAL_TABLET | ORAL | Status: DC

## 2015-06-24 NOTE — Progress Notes (Signed)
   Alexis French  MRN: [id number] DOB: 04/13/1976  Subjective:  Pt presents to clinic for a discussion for medication refills.  She has been doing well.  She is [redacted] weeks pregnant.  She is very swollen and uncomfortable and not sleeping well.  She is stressed because she lives far out and lives alone with no one near by to help when she goes into labor and she is scared she will not be able to get herself to the hospital.  She has no family nearby.  She is not completely happy with her OB practice but it is nearby and convenient.  She is doing well mentally.  Her medications are still working and she would like refills of them today.  She saw her OB today and everything with the pregnancy is fine.  Patient Active Problem List   Diagnosis Date Noted  . Obesity, Class I, BMI 30-34.9 05/01/2013    Priority: Low  . Hashimoto's disease 06/14/2014  . Immune to varicella 12/15/2012  . Immune to measles 12/15/2012  . Immune to mumps 12/15/2012  . Immune to rubella 12/15/2012  . Hypothyroidism 01/27/2012  . ADD (attention deficit disorder) 12/08/2011  . HTN (hypertension) 12/08/2011  . Anxiety and depression 12/08/2011    Current Outpatient Prescriptions on File Prior to Visit  Medication Sig Dispense Refill  . b complex vitamins tablet Take 1 tablet by mouth daily.    . hydrochlorothiazide (HYDRODIURIL) 25 MG tablet Take 1-2 tablets (25-50 mg total) by mouth daily. 90 tablet 0  . mometasone (NASONEX) 50 MCG/ACT nasal spray Place 2 sprays into the nose daily. 17 g 12  . Prenatal Vit-Fe Fumarate-FA (PRENATAL MULTIVITAMIN) TABS tablet Take 1 tablet by mouth daily at 12 noon. 90 tablet 3  . SYNTHROID 200 MCG tablet Take 1 tablet (200 mcg total) by mouth daily before breakfast. Brand name medically necessary 90 tablet 0  . valACYclovir (VALTREX) 1000 MG tablet 1/2 po qd for suppression and increase to 1 pill bid for outbreak 180 tablet 3   No current facility-administered medications on file prior  to visit.    Allergies  Allergen Reactions  . Penicillins Other (See Comments)    Blisters on the soles and her feet    Review of Systems Objective:  BP 152/94 mmHg  Pulse 96  Temp(Src) 98.4 F (36.9 C)  Resp 16  Ht 5' 4.5" (1.638 m)  Wt 265 lb (120.203 kg)  BMI 44.80 kg/m2  SpO2 98%  Physical Exam  Constitutional: She is oriented to person, place, and time and well-developed, well-nourished, and in no distress.  HENT:  Head: Normocephalic and atraumatic.  Right Ear: Hearing and external ear normal.  Left Ear: Hearing and external ear normal.  Eyes: Conjunctivae are normal.  Neck: Normal range of motion.  Pulmonary/Chest: Effort normal.  Neurological: She is alert and oriented to person, place, and time. Gait normal.  Skin: Skin is warm and dry.  Psychiatric: Mood, memory, affect and judgment normal.  Vitals reviewed.   Assessment and Plan :  Anxiety and depression - Plan: busPIRone (BUSPAR) 15 MG tablet, escitalopram (LEXAPRO) 20 MG tablet  Continue current medication.  Reassured patient and helped her think of solutions to her current problems.  Benny LennertSarah Danyle Boening PA-C  [facility] 06/24/2015 8:03 PM

## 2015-09-03 ENCOUNTER — Other Ambulatory Visit: Payer: Self-pay | Admitting: Physician Assistant

## 2015-11-26 ENCOUNTER — Other Ambulatory Visit: Payer: Self-pay | Admitting: Physician Assistant

## 2015-12-04 ENCOUNTER — Other Ambulatory Visit: Payer: Self-pay | Admitting: Physician Assistant

## 2015-12-20 ENCOUNTER — Ambulatory Visit (INDEPENDENT_AMBULATORY_CARE_PROVIDER_SITE_OTHER): Payer: PRIVATE HEALTH INSURANCE | Admitting: Physician Assistant

## 2015-12-20 VITALS — BP 150/100 | HR 76 | Temp 98.4°F | Resp 16 | Ht 64.5 in | Wt 251.4 lb

## 2015-12-20 DIAGNOSIS — I10 Essential (primary) hypertension: Secondary | ICD-10-CM

## 2015-12-20 DIAGNOSIS — F909 Attention-deficit hyperactivity disorder, unspecified type: Secondary | ICD-10-CM | POA: Diagnosis not present

## 2015-12-20 DIAGNOSIS — B009 Herpesviral infection, unspecified: Secondary | ICD-10-CM | POA: Diagnosis not present

## 2015-12-20 DIAGNOSIS — F329 Major depressive disorder, single episode, unspecified: Secondary | ICD-10-CM

## 2015-12-20 DIAGNOSIS — G4700 Insomnia, unspecified: Secondary | ICD-10-CM | POA: Diagnosis not present

## 2015-12-20 DIAGNOSIS — F418 Other specified anxiety disorders: Secondary | ICD-10-CM

## 2015-12-20 DIAGNOSIS — F988 Other specified behavioral and emotional disorders with onset usually occurring in childhood and adolescence: Secondary | ICD-10-CM

## 2015-12-20 DIAGNOSIS — F32A Depression, unspecified: Secondary | ICD-10-CM

## 2015-12-20 DIAGNOSIS — F419 Anxiety disorder, unspecified: Principal | ICD-10-CM

## 2015-12-20 MED ORDER — BUSPIRONE HCL 30 MG PO TABS: 30.0000 mg | ORAL_TABLET | Freq: Two times a day (BID) | ORAL | Status: DC

## 2015-12-20 MED ORDER — AMPHETAMINE-DEXTROAMPHET ER 15 MG PO CP24: 15.0000 mg | ORAL_CAPSULE | ORAL | Status: DC

## 2015-12-20 MED ORDER — AMPHETAMINE-DEXTROAMPHETAMINE 15 MG PO TABS: 15.0000 mg | ORAL_TABLET | Freq: Every day | ORAL | Status: DC | PRN

## 2015-12-20 MED ORDER — ESCITALOPRAM OXALATE 20 MG PO TABS: 30.0000 mg | ORAL_TABLET | Freq: Every day | ORAL | Status: DC

## 2015-12-20 MED ORDER — TEMAZEPAM 7.5 MG PO CAPS: 7.5000 mg | ORAL_CAPSULE | Freq: Every evening | ORAL | Status: DC | PRN

## 2015-12-20 MED ORDER — VALACYCLOVIR HCL 1 G PO TABS: ORAL_TABLET | ORAL | Status: DC

## 2015-12-20 NOTE — Progress Notes (Signed)
Alexis French  MRN: [id number] DOB: Jun 06, 1976  Subjective:  Pt presents to clinic for medication refill.  She has been doing well.  Her daughter is now 32 months old and doing well. She is only waking up once a night.  Currently the patient is under a lot of stress at home and with her daughters father.  She feels like she is doing well on her current medication in regards to her anxiety.  She is not sleeping well at night since bringing her daughter home - she finds that she lays awake waiting for her to wake up. Her ADD is causing her problems and she would like to start back on her Adderall.  She likes the XR during the day but on the weekends she feels like it is to much.  She is seeing endocrinology for her thyroid problem and her dose was recently changed - she was tried on phenteramine but she did not think that it curbed her appetite.  She has noticed since her daughter birth that she no longer wants ETOH which she is glad about.  Mom is living with her Her twin boys are living with her - 38 y/o   Home may be foreclosed which has been stressful  Trazodone - too sleepy with a baby - scares her Needs her adderall for work and home life  Patient Active Problem List   Diagnosis Date Noted  . Morbid obesity (HCC) 01/23/2015  . Hashimoto's disease 06/14/2014  . Immune to varicella 12/15/2012  . Immune to measles 12/15/2012  . Immune to mumps 12/15/2012  . Immune to rubella 12/15/2012  . Hypothyroidism 01/27/2012  . ADD (attention deficit disorder) 12/08/2011  . HTN (hypertension) 12/08/2011  . Anxiety and depression 12/08/2011    Current Outpatient Prescriptions on File Prior to Visit  Medication Sig Dispense Refill  . b complex vitamins tablet Take 1 tablet by mouth daily.    . hydrochlorothiazide (HYDRODIURIL) 25 MG tablet TAKE 1 TO 2 TABLETS(25 TO 50 MG) BY MOUTH DAILY 90 tablet 3  . SYNTHROID 200 MCG tablet Take 1 tablet (200 mcg total) by mouth daily before breakfast.  Brand name medically necessary 90 tablet 0  . Prenatal Vit-Fe Fumarate-FA (PRENATAL MULTIVITAMIN) TABS tablet Take 1 tablet by mouth daily at 12 noon. 90 tablet 3   No current facility-administered medications on file prior to visit.    Allergies  Allergen Reactions  . Penicillins Other (See Comments)    Blisters on the soles and her feet    Review of Systems  Psychiatric/Behavioral: Positive for sleep disturbance and decreased concentration. Negative for dysphoric mood. The patient is not nervous/anxious.    Objective:  Pulse 76  Temp(Src) 98.4 F (36.9 C) (Oral)  Resp 16  Ht 5' 4.5" (1.638 m)  Wt 251 lb 6.4 oz (114.034 kg)  BMI 42.50 kg/m2  SpO2 96%  LMP 12/09/2015  Physical Exam  Constitutional: She is oriented to person, place, and time and well-developed, well-nourished, and in no distress.  HENT:  Head: Normocephalic and atraumatic.  Right Ear: Hearing and external ear normal.  Left Ear: Hearing and external ear normal.  Eyes: Conjunctivae are normal.  Neck: Normal range of motion.  Cardiovascular: Normal rate, regular rhythm and normal heart sounds.   No murmur heard. Pulmonary/Chest: Effort normal and breath sounds normal.  Musculoskeletal:       Right lower leg: She exhibits no edema.       Left lower leg: She exhibits no  edema.  Neurological: She is alert and oriented to person, place, and time. Gait normal.  Skin: Skin is warm and dry.  Psychiatric: Mood, memory, affect and judgment normal.  Vitals reviewed.   Assessment and Plan :  Anxiety and depression - Plan: busPIRone (BUSPAR) 30 MG tablet, escitalopram (LEXAPRO) 20 MG tablet  HSV infection - Plan: valACYclovir (VALTREX) 1000 MG tablet  Essential hypertension - not currently controlled - she will monitor at work and let me know if it is continued to be high.  ADD (attention deficit disorder) - Plan: amphetamine-dextroamphetamine (ADDERALL XR) 15 MG 24 hr capsule, amphetamine-dextroamphetamine  (ADDERALL) 15 MG tablet - restart medications - she needs to monitor her BP and if it stays elevated we will have to be more aggressive with her medications.  Insomnia - Plan: temazepam (RESTORIL) 7.5 MG capsule - trial to see if we can reset her sleep habits   Benny LennertSarah Fedra Lanter PA-C  [facility] 12/20/2015 5:24 PM

## 2015-12-20 NOTE — Patient Instructions (Signed)
     IF you received an x-ray today, you will receive an invoice from [facility]. Please contact [facility] at [phone] with questions or concerns regarding your invoice.   IF you received labwork today, you will receive an invoice from [facility]. Please contact [facility] at [phone] with questions or concerns regarding your invoice.   Our billing staff will not be able to assist you with questions regarding bills from these companies.  You will be contacted with the lab results as soon as they are available. The fastest way to get your results is to activate your My Chart account. Instructions are located on the last page of this paperwork. If you have not heard from us regarding the results in 2 weeks, please contact this office.      

## 2015-12-21 ENCOUNTER — Encounter: Payer: Self-pay | Admitting: Physician Assistant

## 2015-12-23 ENCOUNTER — Other Ambulatory Visit: Payer: Self-pay

## 2015-12-23 ENCOUNTER — Encounter: Payer: Self-pay | Admitting: Physician Assistant

## 2015-12-23 MED ORDER — DESOGESTREL-ETHINYL ESTRADIOL 0.15-30 MG-MCG PO TABS: 1.0000 | ORAL_TABLET | Freq: Every day | ORAL | Status: DC

## 2015-12-23 MED ORDER — FLUTICASONE PROPIONATE 50 MCG/ACT NA SUSP: 2.0000 | Freq: Every day | NASAL | Status: DC

## 2015-12-23 MED ORDER — BUSPIRONE HCL 15 MG PO TABS: 30.0000 mg | ORAL_TABLET | Freq: Two times a day (BID) | ORAL | Status: DC

## 2016-01-15 ENCOUNTER — Encounter: Payer: Self-pay | Admitting: Physician Assistant

## 2016-01-16 ENCOUNTER — Encounter: Payer: Self-pay | Admitting: Physician Assistant

## 2016-01-16 MED ORDER — CHLORTHALIDONE 25 MG PO TABS: 25.0000 mg | ORAL_TABLET | Freq: Every day | ORAL | Status: DC

## 2016-01-16 MED ORDER — TELMISARTAN 40 MG PO TABS: 40.0000 mg | ORAL_TABLET | Freq: Every day | ORAL | Status: DC

## 2016-01-16 NOTE — Telephone Encounter (Signed)
Change from HCTZ to chlorthalidone and add Micardis due to elevated BP

## 2016-03-03 ENCOUNTER — Other Ambulatory Visit: Payer: Self-pay | Admitting: Physician Assistant

## 2016-03-17 ENCOUNTER — Other Ambulatory Visit: Payer: Self-pay | Admitting: Physician Assistant

## 2016-03-17 DIAGNOSIS — B009 Herpesviral infection, unspecified: Secondary | ICD-10-CM

## 2016-05-11 ENCOUNTER — Telehealth: Payer: Self-pay

## 2016-05-11 DIAGNOSIS — B009 Herpesviral infection, unspecified: Secondary | ICD-10-CM

## 2016-05-11 NOTE — Telephone Encounter (Signed)
PATIENT WOULD LIKE SARAH TO KNOW THAT SHE NEEDS A REFILL ON HER CHLORTHALIDONE 25 MG AND VALTREX 1000 MG. SHE WOULD LIKE TO HAVE THEM BOTH FOR 90 DAYS. BEST PHONE [phone] (CELL)  PHARMACY CHOICE IS [facility].  MBC

## 2016-05-14 ENCOUNTER — Other Ambulatory Visit: Payer: Self-pay

## 2016-05-14 DIAGNOSIS — B009 Herpesviral infection, unspecified: Secondary | ICD-10-CM

## 2016-05-14 MED ORDER — VALACYCLOVIR HCL 1 G PO TABS: ORAL_TABLET | ORAL | 0 refills | Status: AC

## 2016-05-14 MED ORDER — CHLORTHALIDONE 25 MG PO TABS: ORAL_TABLET | ORAL | 0 refills | Status: DC

## 2016-05-14 NOTE — Telephone Encounter (Signed)
Pt called and asked me to change the pharm to [facility]. I refilled for 90days but pt is aware of need for f/up in Dec.

## 2016-05-14 NOTE — Telephone Encounter (Signed)
Pt called in requesting refill of [place] & Valtrex. To Maralyn SagoSarah.

## 2016-07-14 ENCOUNTER — Ambulatory Visit (INDEPENDENT_AMBULATORY_CARE_PROVIDER_SITE_OTHER): Payer: PRIVATE HEALTH INSURANCE | Admitting: Physician Assistant

## 2016-07-14 ENCOUNTER — Encounter: Payer: Self-pay | Admitting: Physician Assistant

## 2016-07-14 DIAGNOSIS — F32A Depression, unspecified: Secondary | ICD-10-CM

## 2016-07-14 DIAGNOSIS — F418 Other specified anxiety disorders: Secondary | ICD-10-CM

## 2016-07-14 DIAGNOSIS — I10 Essential (primary) hypertension: Secondary | ICD-10-CM | POA: Diagnosis not present

## 2016-07-14 DIAGNOSIS — F329 Major depressive disorder, single episode, unspecified: Secondary | ICD-10-CM

## 2016-07-14 DIAGNOSIS — F988 Other specified behavioral and emotional disorders with onset usually occurring in childhood and adolescence: Secondary | ICD-10-CM | POA: Diagnosis not present

## 2016-07-14 DIAGNOSIS — F419 Anxiety disorder, unspecified: Secondary | ICD-10-CM

## 2016-07-14 MED ORDER — ESCITALOPRAM OXALATE 20 MG PO TABS: 10.0000 mg | ORAL_TABLET | Freq: Every day | ORAL | 0 refills | Status: DC

## 2016-07-14 MED ORDER — PEN NEEDLES 32G X 4 MM MISC: 1.0000 | Freq: Every day | 0 refills | Status: DC

## 2016-07-14 MED ORDER — AMPHETAMINE-DEXTROAMPHET ER 15 MG PO CP24: 15.0000 mg | ORAL_CAPSULE | ORAL | 0 refills | Status: AC

## 2016-07-14 MED ORDER — TELMISARTAN 40 MG PO TABS: 40.0000 mg | ORAL_TABLET | Freq: Every day | ORAL | 0 refills | Status: DC

## 2016-07-14 MED ORDER — LIRAGLUTIDE -WEIGHT MANAGEMENT 18 MG/3ML ~~LOC~~ SOPN: 0.6000 mg | PEN_INJECTOR | Freq: Every day | SUBCUTANEOUS | 0 refills | Status: AC

## 2016-07-14 NOTE — Patient Instructions (Addendum)
EAP - Chartered loss adjusteremployee assistant program for help with stress related to job  Saxenda - start with 0.6mg  and take daily and then increase by 0.6mg  weekly - until you feel bad or you get to 3mg    See you in a month    IF you received an x-ray today, you will receive an invoice from [facility]. Please contact [facility] at [phone] with questions or concerns regarding your invoice.   IF you received labwork today, you will receive an invoice from [facility]. Please contact [facility] at [phone] with questions or concerns regarding your invoice.   Our billing staff will not be able to assist you with questions regarding bills from these companies.  You will be contacted with the lab results as soon as they are available. The fastest way to get your results is to activate your My Chart account. Instructions are located on the last page of this paperwork. If you have not heard from us regarding the results in 2 weeks, please contact this office.

## 2016-07-14 NOTE — Progress Notes (Signed)
Alexis French  MRN: [id number] DOB: 08-20-1975  Subjective:  Pt presents to clinic with multiple issues - Work issues - in the lab and really having problems with a provider at the office - treat her mean and she has gone to her supervisor who states she can do nothing about the issues - she recently has called HR and they are going to investigate - very frustrated and wants to look for another job but also wants to be careful with moving around a lot  Not sleeping well - because she has a daughter who does not sleep through the night - the restoril works but she can not be drowsy due to her daughter Weight issues - losing weight on her own - afraid that the Lexapro increased her weight - she tried [employer] but it did not help with weight loss - she is trying to eat healthy - does not think that she over eats - drinks no ETOH Hair falling out - happens when she stressed -  She has stopped all her medications except for her chlorthalidone -  BP not controlled but she stopped her micardis  She wants to go back to school - she has her house paid off and she is thinking about selling it and moving to [facility] where there is an Interior and spatial designer that she thinks that she can get in.  Currently mom and both 54 y/o sons are living with her - they help with her daughter but it is stressful with her sons not doing anything with her life.  Not in therapy and not using EAP with work due to she was not aware that this was available to her.  She sees her endocrinologist regularly who has done labs recently.  Review of Systems  Constitutional: Negative for chills and fever.  Psychiatric/Behavioral: Positive for decreased concentration (stopped Adderall not no specific reason - not really wanting to go back on it but has no concentration), dysphoric mood and sleep disturbance. The patient is nervous/anxious.    Patient Active Problem List   Diagnosis Date Noted  . Morbid obesity (HCC) 01/23/2015  . Hashimoto's  disease - has endocrinologist 06/14/2014  . Immune to varicella 12/15/2012  . Immune to measles 12/15/2012  . Immune to mumps 12/15/2012  . Immune to rubella 12/15/2012  . Hypothyroidism 01/27/2012  . ADD (attention deficit disorder) 12/08/2011  . HTN (hypertension) 12/08/2011  . Anxiety and depression 12/08/2011    Current Outpatient Prescriptions on File Prior to Visit  Medication Sig Dispense Refill  . chlorthalidone (HYGROTON) 25 MG tablet TAKE ONE (1) TABLET BY MOUTH ONCE DAILY 90 tablet 0  . SYNTHROID 200 MCG tablet Take 1 tablet (200 mcg total) by mouth daily before breakfast. Brand name medically necessary (Patient taking differently: Take 200 mcg by mouth daily before breakfast. Brand name medically necessary) 90 tablet 0  . valACYclovir (VALTREX) 1000 MG tablet 1/2 po qd for suppression and increase to 1 pill bid for outbreak 180 tablet 0  . b complex vitamins tablet Take 1 tablet by mouth daily.     No current facility-administered medications on file prior to visit.     Allergies  Allergen Reactions  . Penicillins Other (See Comments)    Blisters on the soles and her feet    Pt patients past, family and social history were reviewed and updated.   Objective:  BP (!) 153/94 (BP Location: Right Arm, Patient Position: Sitting, Cuff Size: Large)   Pulse 80  Temp 98.2 F (36.8 C) (Oral)   Resp 18   Ht 5' 4.5" (1.638 m)   Wt 245 lb (111.1 kg)   SpO2 97%   BMI 41.40 kg/m   Physical Exam  Constitutional: She is oriented to person, place, and time and well-developed, well-nourished, and in no distress.  HENT:  Head: Normocephalic and atraumatic.  Right Ear: Hearing and external ear normal.  Left Ear: Hearing and external ear normal.  Eyes: Conjunctivae are normal.  Neck: Normal range of motion.  Pulmonary/Chest: Effort normal.  Neurological: She is alert and oriented to person, place, and time. Gait normal.  Skin: Skin is warm and dry.  Psychiatric: Mood,  memory, affect and judgment normal.  tearful  Vitals reviewed.   Assessment and Plan :  Morbid obesity (HCC) - Plan: Liraglutide -Weight Management (SAXENDA) 18 MG/3ML SOPN, DISCONTINUED: Insulin Pen Needle (PEN NEEDLES) 32G X 4 MM MISC - trial of this medication to see if she can lose some weight - exercise encouraged - recheck in 1 month to monitor for change  Anxiety and depression - Plan: DISCONTINUED: escitalopram (LEXAPRO) 20 MG tablet - restart medication - pt is having a really hard time at work and we talked about ways to help this stress and frustration - I doubt that this will cause weight but we discussed the risks vs benefits of not being on this medication long term with her mental health and history of stress  Essential hypertension - Plan: DISCONTINUED: telmisartan (MICARDIS) 40 MG tablet - restart medications  Attention deficit disorder (ADD) without hyperactivity - Plan: amphetamine-dextroamphetamine (ADDERALL XR) 15 MG 24 hr capsule - restart - pt is having trouble with frustration which I am concerned is making her concentration worse and likely visa versa  Benny LennertSarah Susanne Baumgarner PA-C  [facility] 07/15/2016 1:25 PM

## 2016-07-15 ENCOUNTER — Telehealth: Payer: Self-pay | Admitting: Family Medicine

## 2016-07-15 ENCOUNTER — Other Ambulatory Visit: Payer: Self-pay | Admitting: Physician Assistant

## 2016-07-15 ENCOUNTER — Other Ambulatory Visit: Payer: Self-pay

## 2016-07-15 DIAGNOSIS — F32A Depression, unspecified: Secondary | ICD-10-CM

## 2016-07-15 DIAGNOSIS — F419 Anxiety disorder, unspecified: Principal | ICD-10-CM

## 2016-07-15 DIAGNOSIS — I10 Essential (primary) hypertension: Secondary | ICD-10-CM

## 2016-07-15 DIAGNOSIS — F329 Major depressive disorder, single episode, unspecified: Secondary | ICD-10-CM

## 2016-07-15 MED ORDER — PEN NEEDLES 32G X 4 MM MISC: 1.0000 | Freq: Every day | 0 refills | Status: AC

## 2016-07-15 MED ORDER — ESCITALOPRAM OXALATE 20 MG PO TABS: 10.0000 mg | ORAL_TABLET | Freq: Every day | ORAL | 0 refills | Status: DC

## 2016-07-15 MED ORDER — CHLORTHALIDONE 25 MG PO TABS: ORAL_TABLET | ORAL | 0 refills | Status: DC

## 2016-07-15 MED ORDER — TELMISARTAN 40 MG PO TABS: 40.0000 mg | ORAL_TABLET | Freq: Every day | ORAL | 0 refills | Status: DC

## 2016-07-15 MED ORDER — TELMISARTAN 40 MG PO TABS: 40.0000 mg | ORAL_TABLET | Freq: Every day | ORAL | 0 refills | Status: AC

## 2016-07-15 NOTE — Telephone Encounter (Signed)
Pt call stating that the medicine Liraglutide needs a priauthorization please resend to [facility] phone number is [phone]

## 2016-07-21 NOTE — Telephone Encounter (Signed)
Form being sent

## 2016-07-24 ENCOUNTER — Telehealth: Payer: Self-pay

## 2016-07-24 NOTE — Telephone Encounter (Signed)
Fax req for PA for Saxenda  Completed in covermymeds

## 2016-07-28 NOTE — Telephone Encounter (Signed)
Denied not covered  Patient needs to contact the insurance member service is message on denial  md can call for appeal [phone]

## 2016-07-30 NOTE — Telephone Encounter (Signed)
This is strange I have talked to the patient who has gotten the medication.

## 2016-07-30 NOTE — Telephone Encounter (Signed)
Pa needed duxw86 cover my meds

## 2016-07-31 NOTE — Telephone Encounter (Signed)
Deniedon January 22 Request Reference Number: [id number]. SAXENDA INJ 6MG /ML is denied due to Plan Exclusion. For further questions, call [phone]. Appeals are not supported through ePA. Please refer to the written case notice for appeals information and instructions.

## 2016-08-03 NOTE — Telephone Encounter (Signed)
I believe pt has already gotten this medication.

## 2016-08-11 ENCOUNTER — Other Ambulatory Visit: Payer: Self-pay | Admitting: Physician Assistant

## 2016-08-12 NOTE — Telephone Encounter (Signed)
Plan was to recheck with patient in 4 weeks per PA-Weber's note from 07/14/2016. Patient prefers to hold off on this. She plans on contacting PA-Weber personally.  BP Readings from Last 3 Encounters:  07/14/16 (!) 153/94  12/20/15 (!) 150/100  06/24/15 (!) 152/94

## 2016-08-13 ENCOUNTER — Telehealth: Payer: Self-pay

## 2016-08-13 NOTE — Telephone Encounter (Signed)
clorthalidone needs PA [id number] Cover my meds

## 2016-08-18 NOTE — Telephone Encounter (Signed)
Pa started by me today 

## 2016-08-19 NOTE — Telephone Encounter (Signed)
No pa needed pharma advised

## 2016-12-17 ENCOUNTER — Telehealth: Payer: Self-pay | Admitting: Physician Assistant

## 2016-12-17 MED ORDER — ESCITALOPRAM OXALATE 20 MG PO TABS: 20.0000 mg | ORAL_TABLET | Freq: Every day | ORAL | 0 refills | Status: DC

## 2016-12-17 MED ORDER — CHLORTHALIDONE 25 MG PO TABS: ORAL_TABLET | ORAL | 0 refills | Status: DC

## 2016-12-17 NOTE — Telephone Encounter (Signed)
Done

## 2016-12-17 NOTE — Telephone Encounter (Signed)
Alexis French - Pt needs a 90-day refill on both her lexapro 30 and chlorthalidone 25 mg.  She also needs to know when she needs to schedule another appointment. [phone]

## 2016-12-17 NOTE — Telephone Encounter (Signed)
I spoke with the pt and advised her per your note from her January visit you wanted to see her back in a month. The pt stated "No" we talked and text each other all the time and her insurance doesn't pay very well, and she used to work her. I explained to her that I can only advise her on what you had written in you Plan and once you address the medication refills I would call her back. Pt verbalized understanding

## 2017-01-15 ENCOUNTER — Telehealth: Payer: Self-pay | Admitting: Physician Assistant

## 2017-01-15 NOTE — Telephone Encounter (Signed)
90 day supply was just given on 12/17/2016 with no refills. Looking at your note looks like you wanted to taper patient off medication. Please advise.

## 2017-01-15 NOTE — Telephone Encounter (Signed)
Pt req a 90 day supply med refill on her lexapro.  She req that I only send this req. directly to Weber but I adv all req. goes into a pool.  Contact number [phone]

## 2017-01-18 ENCOUNTER — Other Ambulatory Visit: Payer: Self-pay

## 2017-01-18 ENCOUNTER — Other Ambulatory Visit: Payer: Self-pay | Admitting: Physician Assistant

## 2017-01-18 ENCOUNTER — Telehealth: Payer: Self-pay | Admitting: Physician Assistant

## 2017-01-18 DIAGNOSIS — F329 Major depressive disorder, single episode, unspecified: Secondary | ICD-10-CM

## 2017-01-18 DIAGNOSIS — F32A Depression, unspecified: Secondary | ICD-10-CM

## 2017-01-18 DIAGNOSIS — F419 Anxiety disorder, unspecified: Principal | ICD-10-CM

## 2017-01-18 MED ORDER — ESCITALOPRAM OXALATE 20 MG PO TABS: 20.0000 mg | ORAL_TABLET | Freq: Every day | ORAL | 0 refills | Status: DC

## 2017-01-18 MED ORDER — ESCITALOPRAM OXALATE 20 MG PO TABS: 30.0000 mg | ORAL_TABLET | Freq: Every day | ORAL | 0 refills | Status: AC

## 2017-01-18 NOTE — Telephone Encounter (Signed)
Pt is asking for pharm change to: [facility]. Requesting lexapro 30 mg in 90 day supply

## 2017-01-18 NOTE — Telephone Encounter (Signed)
Done

## 2017-01-18 NOTE — Telephone Encounter (Signed)
Alexis LennertSarah Weber completed this.

## 2017-01-18 NOTE — Telephone Encounter (Signed)
Alexis French PT CALLING FOR HER REFILL ON LEXAPRO SHE STATES THAT HER AND Alexis French TALKED ABOUT HER STAYING AT SAME DOSAGE  PLEASE RESPOND

## 2017-01-19 ENCOUNTER — Other Ambulatory Visit: Payer: Self-pay | Admitting: Urgent Care

## 2017-01-19 ENCOUNTER — Other Ambulatory Visit: Payer: Self-pay | Admitting: Physician Assistant
